# Patient Record
Sex: Male | Born: 1960 | Race: White | Hispanic: No | Marital: Married | Smoking: Former smoker
Health system: Southern US, Community
[De-identification: ages and names within clinical notes are randomized; demographics above are authoritative.]

## PROBLEM LIST (undated history)

## (undated) ENCOUNTER — Emergency Department (HOSPITAL_COMMUNITY): Admission: EM

## (undated) DIAGNOSIS — R739 Hyperglycemia, unspecified: Secondary | ICD-10-CM

## (undated) DIAGNOSIS — E7800 Pure hypercholesterolemia, unspecified: Secondary | ICD-10-CM

## (undated) DIAGNOSIS — E785 Hyperlipidemia, unspecified: Secondary | ICD-10-CM

## (undated) HISTORY — PX: BILATERAL CARPAL TUNNEL RELEASE: SHX6508

## (undated) HISTORY — PX: CARPAL TUNNEL WITH CUBITAL TUNNEL: SHX5608

## (undated) HISTORY — DX: Pure hypercholesterolemia, unspecified: E78.00

## (undated) HISTORY — DX: Hyperlipidemia, unspecified: E78.5

## (undated) HISTORY — DX: Hyperglycemia, unspecified: R73.9

---

## 2006-04-16 ENCOUNTER — Ambulatory Visit: Payer: Self-pay | Admitting: Internal Medicine

## 2006-04-16 LAB — CONVERTED CEMR LAB
Albumin: 4 g/dL (ref 3.5–5.2)
Alkaline Phosphatase: 54 units/L (ref 39–117)
BUN: 15 mg/dL (ref 6–23)
Basophils Absolute: 0 10*3/uL (ref 0.0–0.1)
Bilirubin Urine: NEGATIVE
GFR calc Af Amer: 105 mL/min
HDL: 39.7 mg/dL (ref >39.0)
Hemoglobin, Urine: NEGATIVE
Hemoglobin: 16.1 g/dL (ref 13.0–17.0)
Leukocytes, UA: NEGATIVE
Lymphocytes Relative: 39.2 % (ref 12.0–46.0)
MCHC: 35.4 g/dL (ref 30.0–36.0)
Monocytes Absolute: 0.6 10*3/uL (ref 0.2–0.7)
Monocytes Relative: 8.1 % (ref 3.0–11.0)
Neutro Abs: 3.9 10*3/uL (ref 1.4–7.7)
Potassium: 4.2 meq/L (ref 3.5–5.1)
TSH: 1.21 microintl units/mL (ref 0.35–5.50)
Total Protein: 7.2 g/dL (ref 6.0–8.3)
Triglycerides: 186 mg/dL — ABNORMAL HIGH (ref 0–149)
VLDL: 37 mg/dL (ref 0–40)

## 2006-04-20 ENCOUNTER — Ambulatory Visit: Payer: Self-pay | Admitting: Internal Medicine

## 2006-05-28 ENCOUNTER — Ambulatory Visit (HOSPITAL_BASED_OUTPATIENT_CLINIC_OR_DEPARTMENT_OTHER): Admission: RE | Admit: 2006-05-28 | Discharge: 2006-05-28 | Payer: Self-pay | Admitting: Otolaryngology

## 2006-06-07 ENCOUNTER — Ambulatory Visit: Payer: Self-pay | Admitting: Internal Medicine

## 2008-08-23 ENCOUNTER — Ambulatory Visit: Payer: Self-pay | Admitting: Internal Medicine

## 2008-08-23 LAB — CONVERTED CEMR LAB
ALT: 67 units/L — ABNORMAL HIGH (ref 0–53)
AST: 39 units/L — ABNORMAL HIGH (ref 0–37)
Albumin: 4 g/dL (ref 3.5–5.2)
Alkaline Phosphatase: 62 units/L (ref 39–117)
CO2: 27 meq/L (ref 19–32)
Calcium: 9 mg/dL (ref 8.4–10.5)
Chloride: 105 meq/L (ref 96–112)
Eosinophils Relative: 1.2 % (ref 0.0–5.0)
Glucose, Bld: 110 mg/dL — ABNORMAL HIGH (ref 70–99)
HCT: 44.9 % (ref 39.0–52.0)
Hemoglobin: 15.9 g/dL (ref 13.0–17.0)
Leukocytes, UA: NEGATIVE
Lymphocytes Relative: 23.5 % (ref 12.0–46.0)
Lymphs Abs: 2.6 10*3/uL (ref 0.7–4.0)
Monocytes Relative: 8.5 % (ref 3.0–12.0)
Nitrite: NEGATIVE
Platelets: 226 10*3/uL (ref 150.0–400.0)
Sodium: 140 meq/L (ref 135–145)
Specific Gravity, Urine: 1.025 (ref 1.000–1.030)
Total CHOL/HDL Ratio: 4
Total Protein: 7.4 g/dL (ref 6.0–8.3)
Triglycerides: 182 mg/dL — ABNORMAL HIGH (ref 0.0–149.0)
Urobilinogen, UA: 0.2 (ref 0.0–1.0)
WBC: 10.9 10*3/uL — ABNORMAL HIGH (ref 4.5–10.5)
pH: 5.5 (ref 5.0–8.0)

## 2008-08-28 ENCOUNTER — Ambulatory Visit: Payer: Self-pay | Admitting: Internal Medicine

## 2008-08-28 DIAGNOSIS — G5600 Carpal tunnel syndrome, unspecified upper limb: Secondary | ICD-10-CM | POA: Insufficient documentation

## 2008-08-28 DIAGNOSIS — I10 Essential (primary) hypertension: Secondary | ICD-10-CM

## 2008-08-28 DIAGNOSIS — J309 Allergic rhinitis, unspecified: Secondary | ICD-10-CM | POA: Insufficient documentation

## 2008-08-28 DIAGNOSIS — G4733 Obstructive sleep apnea (adult) (pediatric): Secondary | ICD-10-CM

## 2008-08-29 DIAGNOSIS — K219 Gastro-esophageal reflux disease without esophagitis: Secondary | ICD-10-CM | POA: Insufficient documentation

## 2008-09-20 ENCOUNTER — Telehealth (INDEPENDENT_AMBULATORY_CARE_PROVIDER_SITE_OTHER): Payer: Self-pay | Admitting: *Deleted

## 2008-09-20 ENCOUNTER — Ambulatory Visit: Payer: Self-pay | Admitting: Internal Medicine

## 2008-12-04 ENCOUNTER — Encounter: Payer: Self-pay | Admitting: Internal Medicine

## 2010-01-16 ENCOUNTER — Ambulatory Visit: Payer: Self-pay | Admitting: Internal Medicine

## 2010-01-16 ENCOUNTER — Encounter: Payer: Self-pay | Admitting: Internal Medicine

## 2010-01-17 LAB — CONVERTED CEMR LAB
ALT: 95 units/L — ABNORMAL HIGH (ref 0–53)
AST: 53 units/L — ABNORMAL HIGH (ref 0–37)
Alkaline Phosphatase: 56 units/L (ref 39–117)
Basophils Absolute: 0 10*3/uL (ref 0.0–0.1)
Bilirubin Urine: NEGATIVE
Calcium: 9.9 mg/dL (ref 8.4–10.5)
Cholesterol: 189 mg/dL (ref 0–200)
Eosinophils Relative: 2.3 % (ref 0.0–5.0)
GFR calc non Af Amer: 87.95 mL/min (ref >60)
HCT: 43.4 % (ref 39.0–52.0)
Hemoglobin, Urine: NEGATIVE
Hemoglobin: 15.1 g/dL (ref 13.0–17.0)
Ketones, ur: NEGATIVE mg/dL
LDL Cholesterol: 114 mg/dL — ABNORMAL HIGH (ref 0–99)
Leukocytes, UA: NEGATIVE
Lymphocytes Relative: 34.6 % (ref 12.0–46.0)
Lymphs Abs: 2.9 10*3/uL (ref 0.7–4.0)
Monocytes Relative: 9.3 % (ref 3.0–12.0)
Neutro Abs: 4.5 10*3/uL (ref 1.4–7.7)
Nitrite: NEGATIVE
PSA: 0.6 ng/mL (ref 0.10–4.00)
Platelets: 230 10*3/uL (ref 150.0–400.0)
Potassium: 4.5 meq/L (ref 3.5–5.1)
Sodium: 140 meq/L (ref 135–145)
TSH: 0.84 microintl units/mL (ref 0.35–5.50)
Total Bilirubin: 0.7 mg/dL (ref 0.3–1.2)
Urobilinogen, UA: 0.2 (ref 0.0–1.0)
VLDL: 33.8 mg/dL (ref 0.0–40.0)
WBC: 8.5 10*3/uL (ref 4.5–10.5)
pH: 5.5 (ref 5.0–8.0)

## 2010-04-02 NOTE — Assessment & Plan Note (Signed)
Summary: post Urgent care [place]/#?cd   Vital Signs:  Patient profile:   50 year old male Height:      70 inches Weight:      258 pounds BMI:     37.15 O2 Sat:      95 % on Room air Temp:     98.4 degrees F oral Pulse rate:   82 / minute BP sitting:   120 / 62  (left arm) Cuff size:   large  Vitals Entered By: Zella Ball Ewing CMA Duncan Dull) (January 16, 2010 4:03 PM)  O2 Flow:  Room air  Preventive Care Screening     declines tetenus for nopw  CC: Post Urgent Care/RE   CC:  Post Urgent Care/RE.  History of Present Illness: here for wellness and f/u  - overall doing ok, but was tx recently per [facility] for bronchitis with zpack and tess perle as needed,  asked to f/u here today, and at least 75% improved with less headahce, ST, prod cough and mild sob.  BP there normal, has not been taking the lisinopril for several months, not checking BP at home. Pt denies CP, worsening sob, doe, wheezing, orthopnea, pnd, worsening LE edema, palps, dizziness or syncope  Pt denies new neuro symptoms such as headache, facial or extremity weakness  Pt denies polydipsia, polyuria.  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  No wt loss, night sweats, loss of appetite or other constitutional symptoms Pt states good ability with ADL's, low fall risk, home safety reviewed and adequate, no significant change in hearing or vision, trying to follow lower chol diet, and occasionally active only with regular excercise.  Denies worsening depressive symptoms, suicidal ideation, or panic.    Preventive Screening-Counseling & Management      Drug Use:  no.    Problems Prior to Update: 1)  Carpal Tunnel Syndrome, Bilateral  (ICD-354.0) 2)  Gerd  (ICD-530.81) 3)  Allergic Rhinitis  (ICD-477.9) 4)  Hypertension  (ICD-401.9) 5)  Preventive Health Care  (ICD-V70.0) 6)  Obstructive Sleep Apnea   (ICD-327.23)  Medications Prior to Update: 1)  Daily Multiple Vitamins  Tabs (Multiple Vitamin) .Marland Kitchen.. 1 By Mouth Once Daily 2)  Fluticasone Propionate 50 Mcg/act Susp (Fluticasone Propionate) .... 2 Spray/side Once Daily 3)  Lisinopril 20 Mg Tabs (Lisinopril) .Marland Kitchen.. 1 By Mouth Once Daily 4)  Azithromycin 250 Mg Tabs (Azithromycin) .... 2po Qd For 1 Day, Then 1po Qd For 4days, Then Stop 5)  Promethazine-Codeine 6.25-10 Mg/10ml Syrp (Promethazine-Codeine) .Marland Kitchen.. 1 Tsp By Mouth Q 6 Hrs As Needed Cough 6)  Aspir-Low 81 Mg Tbec (Aspirin) .Marland Kitchen.. 1po Once Daily  Current Medications (verified): 1)  Fluticasone Propionate 50 Mcg/act Susp (Fluticasone Propionate) .... 2 Spray/side Once Daily 2)  Azithromycin 250 Mg Tabs (Azithromycin) .... 2po Qd For 1 Day, Then 1po Qd For 4days, Then Stop 3)  Aspir-Low 81 Mg Tbec (Aspirin) .Marland Kitchen.. 1po Once Daily  Allergies (verified): 1)  ! Penicillin 2)  ! 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 arthritis elevated cholesterol heart disease HTN  Social History: Reviewed history from 08/28/2008 and no changes required. 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         all otherwise negative per pt -  except ongoin LUE CTS symptoms x 4 yrs, now worse pain   Physical Exam  General:  alert and overweight-appearing.   Head:  normocephalic and atraumatic.   Eyes:  vision grossly intact, pupils equal, and pupils round.   Ears:  R ear normal and L ear normal.   Nose:  no external deformity and no nasal discharge.   Mouth:  pharyngeal erythema and fair dentition.   Neck:  supple and no masses.   Lungs:  normal respiratory effort and normal breath sounds.   Heart:  normal rate, regular rhythm, and no murmur.   Abdomen:  soft, non-tender, and normal bowel sounds.   Msk:  no joint tenderness and no joint swelling.   Extremities:  no edema, no ulcers  Neurologic:  cranial nerves II-XII intact and strength normal in all extremities.   Skin:  color normal and no rashes.   Psych:  not depressed appearing and slightly anxious.     Impression & Recommendations:  Problem # 1:  Preventive Health Care (ICD-V70.0) Overall doing well, age appropriate education and counseling updated, referral for preventive services and immunizations addressed, dietary counseling and smoking status adressed , most recent labs reviewed, ecg reviewed I have personally reviewed and have noted 1.The patient's medical and social history 2.Their use of alcohol, tobacco or illicit drugs 3.Their current medications and supplements 4. Functional ability including ADL's, fall risk, home safety risk, hearing & visual impairment  5.Diet and physical  activities 6.Evidence for depression or mood disorders The patients weight, height, BMI  have been recorded in the chart I have made referrals, counseling and provided education to the patient based review of the above  Orders: TLB-BMP (Basic Metabolic Panel-BMET) (80048-METABOL) TLB-CBC Platelet - w/Differential (85025-CBCD) TLB-Hepatic/Liver Function Pnl (80076-HEPATIC) TLB-Lipid Panel (80061-LIPID) TLB-TSH (Thyroid Stimulating Hormone) (84443-TSH) TLB-PSA (Prostate Specific Antigen) (84153-PSA) TLB-Udip ONLY (81003-UDIP) EKG w/ Interpretation (93000)  Problem # 2:  CARPAL TUNNEL SYNDROME, BILATERAL (ICD-354.0) left  > right, ongoing for > 4 yrs, pt now wanting surgury as has been proposed - ok for referral Orders: Orthopedic Surgeon Referral (Ortho Surgeon)  Problem # 3:  HYPERTENSION (ICD-401.9)  The following medications were removed from the medication list:    Lisinopril 20 Mg Tabs (Lisinopril) .Marland Kitchen... 1 by mouth once daily improved for unclear reasons except he has been working on lower salt diet - ok off ACE for now  BP today: 120/62 Prior BP: 130/78 (09/20/2008)  Labs Reviewed: K+: 4.1 (08/23/2008) Creat: : 1.0 (08/23/2008)   Chol: 169 (08/23/2008)   HDL: 39.40 (08/23/2008)   LDL: 93 (08/23/2008)   TG: 182.0 (  08/23/2008)  Complete Medication List: 1)  Fluticasone Propionate 50 Mcg/act Susp (Fluticasone propionate) .... 2 spray/side once daily 2)  Azithromycin 250 Mg Tabs (Azithromycin) .... 2po qd for 1 day, then 1po qd for 4days, then stop 3)  Aspir-low 81 Mg Tbec (Aspirin) .Marland Kitchen.. 1po once daily  Patient Instructions: 1)  Continue all previous medications as before this visit 2)  Please go to the Lab in the basement for your blood and/or urine tests today 3)  Please call the number on the [facility] Card for results of your testing  4)  You will be contacted about the referral(s) to: Dr Amanda Pea 5)  Your EKG was good today 6)  Please schedule a follow-up appointment in 1  year, or sooner if needed 7)  Check your Blood Pressure regularly. If it is above 140/90: you should make an appointment. Prescriptions: FLUTICASONE PROPIONATE 50 MCG/ACT SUSP (FLUTICASONE PROPIONATE) 2 spray/side once daily  #1 x 11   Entered and Authorized by:   Corwin Levins MD   Signed by:   Corwin Levins MD on 01/16/2010   Method used:   Print then Give to Patient   RxID:   [id number]    Orders Added: 1)  Orthopedic Surgeon Referral Gaylord Shih Surgeon] 2)  TLB-BMP (Basic Metabolic Panel-BMET) [80048-METABOL] 3)  TLB-CBC Platelet - w/Differential [85025-CBCD] 4)  TLB-Hepatic/Liver Function Pnl [80076-HEPATIC] 5)  TLB-Lipid Panel [80061-LIPID] 6)  TLB-TSH (Thyroid Stimulating Hormone) [84443-TSH] 7)  TLB-PSA (Prostate Specific Antigen) [84153-PSA] 8)  TLB-Udip ONLY [81003-UDIP] 9)  EKG w/ Interpretation [93000] 10)  Est. Patient 40-64 years [14782]

## 2010-06-26 DIAGNOSIS — Z0289 Encounter for other administrative examinations: Secondary | ICD-10-CM

## 2010-07-19 NOTE — Procedures (Signed)
NAME:  ZEB, RAWL NO.:  [id number]   MEDICAL RECORD NO.:  [id number]          PATIENT TYPE:  OUT   LOCATION:  SLEEP CENTER                 FACILITY:  [facility]   PHYSICIAN:  Clinton D. Maple Hudson, MD, FCCP, FACPDATE OF BIRTH:  1960-12-06   DATE OF STUDY:  05/28/2006                            NOCTURNAL POLYSOMNOGRAM   REFERRING PHYSICIAN:   INDICATION FOR STUDY:  Hypersomnia with sleep apnea.   EPWORTH SLEEPINESS SCORE:  18/24, BMI 32.8, weight 235 pounds.   MEDICATIONS:  Listed and reviewed.   SLEEP ARCHITECTURE:  Total sleep time 372 minutes with sleep efficiency  89%.  Stage I was 5%, stage II 78%, stages III and IV absent, REM 18% of  total sleep time.  Sleep latency 3 minutes, REM latency 112 minutes.  Awake after sleep onset 44 minutes.  Arousal index 8.9.  Three Advil  were taken at bedtime for headache.   RESPIRATORY DATA:  Split study protocol.  Apnea/hypopnea index (AHI,  RDI) 107.4 obstructive events per hour, indicating severe obstructive  sleep apnea/hypopnea syndrome before CPAP.  This included 144  obstructive apneas and 87 hypopneas before CPAP.  All sleep and all  events were while supine.  REM AHI 5.5 per hour.  CPAP was titrated 212  CWP, AHI 2.4 per hour.  A small mirage Quattro full face mask was used  with heated humidifier.   OXYGEN DATA:  Moderate snoring with oxygen desaturation to a nadir of  84%.  Mean oxygen saturation after CPAP control was 95% on room air.   CARDIAC DATA:  Normal sinus rhythm.   MOVEMENT-PARASOMNIA:  Occasional limb jerk, insignificant.   IMPRESSIONS-RECOMMENDATIONS:  1. Severe obstructive sleep apnea/hypopnea syndrome, AHI 107.4 per      hour.  All sleep and all events were while supine.  Moderate      snoring with oxygen desaturation to a nadir of 84%.  2. Successful CPAP titration to 12 CWP, AHI 2.4 per hour.  A small      Mirage Quattro full face mask was used      with heated humidifier.  3. Note that he  took three Advil at the time for headache.      Clinton D. Maple Hudson, MD, [facility], FACP  Diplomate, Biomedical engineer of Sleep Medicine  Electronically Signed     CDY/MEDQ  D:  06/07/2006 12:50:42  T:  06/07/2006 22:49:55  Job:  [id number]

## 2011-05-02 ENCOUNTER — Other Ambulatory Visit: Payer: Self-pay | Admitting: Internal Medicine

## 2012-08-20 ENCOUNTER — Telehealth: Payer: Self-pay

## 2012-08-20 NOTE — Telephone Encounter (Signed)
Sorry, No, as he has not been seen here apparently for many years (> 3 yrs)

## 2012-08-20 NOTE — Telephone Encounter (Signed)
Patient wife called stating that he recently had a DOT physical done at [facility] due to our office no longer preforming them. In addition to the CPX, pt employer is requiring a letter from PCP stating that he is compliant with his CPAP machine. Thanks

## 2012-08-23 NOTE — Telephone Encounter (Signed)
Patient informed and he did state request had already been taken care of.

## 2014-05-10 ENCOUNTER — Encounter: Payer: Self-pay | Admitting: Internal Medicine

## 2014-06-11 ENCOUNTER — Other Ambulatory Visit: Payer: Self-pay | Admitting: Internal Medicine

## 2014-08-31 ENCOUNTER — Other Ambulatory Visit (INDEPENDENT_AMBULATORY_CARE_PROVIDER_SITE_OTHER): Payer: BLUE CROSS/BLUE SHIELD

## 2014-08-31 ENCOUNTER — Encounter: Payer: Self-pay | Admitting: Internal Medicine

## 2014-08-31 ENCOUNTER — Ambulatory Visit (INDEPENDENT_AMBULATORY_CARE_PROVIDER_SITE_OTHER): Payer: BLUE CROSS/BLUE SHIELD | Admitting: Internal Medicine

## 2014-08-31 VITALS — BP 138/88 | HR 78 | Temp 98.6°F | Ht 71.0 in | Wt 268.0 lb

## 2014-08-31 DIAGNOSIS — Z0000 Encounter for general adult medical examination without abnormal findings: Secondary | ICD-10-CM

## 2014-08-31 DIAGNOSIS — Z0001 Encounter for general adult medical examination with abnormal findings: Secondary | ICD-10-CM | POA: Insufficient documentation

## 2014-08-31 DIAGNOSIS — G4733 Obstructive sleep apnea (adult) (pediatric): Secondary | ICD-10-CM

## 2014-08-31 DIAGNOSIS — Z0189 Encounter for other specified special examinations: Secondary | ICD-10-CM

## 2014-08-31 LAB — BASIC METABOLIC PANEL
BUN: 20 mg/dL (ref 6–23)
CO2: 28 mEq/L (ref 19–32)
Calcium: 9.4 mg/dL (ref 8.4–10.5)
Chloride: 102 mEq/L (ref 96–112)
Creatinine, Ser: 1.05 mg/dL (ref 0.40–1.50)
GFR: 78.77 mL/min (ref >60.00)
Glucose, Bld: 109 mg/dL — ABNORMAL HIGH (ref 70–99)
Potassium: 4.2 mEq/L (ref 3.5–5.1)
Sodium: 137 mEq/L (ref 135–145)

## 2014-08-31 LAB — CBC WITH DIFFERENTIAL/PLATELET
Basophils Absolute: 0 10*3/uL (ref 0.0–0.1)
Basophils Relative: 0.2 % (ref 0.0–3.0)
Eosinophils Absolute: 0.1 10*3/uL (ref 0.0–0.7)
Eosinophils Relative: 0.8 % (ref 0.0–5.0)
HCT: 48.4 % (ref 39.0–52.0)
Hemoglobin: 16.5 g/dL (ref 13.0–17.0)
Lymphocytes Relative: 28.4 % (ref 12.0–46.0)
Lymphs Abs: 3.8 10*3/uL (ref 0.7–4.0)
MCHC: 34.1 g/dL (ref 30.0–36.0)
MCV: 94.5 fl (ref 78.0–100.0)
Monocytes Absolute: 1 10*3/uL (ref 0.1–1.0)
Monocytes Relative: 7.3 % (ref 3.0–12.0)
Neutro Abs: 8.6 10*3/uL — ABNORMAL HIGH (ref 1.4–7.7)
Neutrophils Relative %: 63.3 % (ref 43.0–77.0)
Platelets: 269 10*3/uL (ref 150.0–400.0)
RBC: 5.12 Mil/uL (ref 4.22–5.81)
RDW: 13.3 % (ref 11.5–15.5)
WBC: 13.6 10*3/uL — ABNORMAL HIGH (ref 4.0–10.5)

## 2014-08-31 LAB — URINALYSIS, ROUTINE W REFLEX MICROSCOPIC
BILIRUBIN URINE: NEGATIVE
HGB URINE DIPSTICK: NEGATIVE
KETONES UR: NEGATIVE
LEUKOCYTES UA: NEGATIVE
Nitrite: NEGATIVE
PH: 6 (ref 5.0–8.0)
RBC / HPF: NONE SEEN (ref 0–?)
Total Protein, Urine: NEGATIVE
Urine Glucose: NEGATIVE
Urobilinogen, UA: 0.2 (ref 0.0–1.0)
WBC, UA: NONE SEEN (ref 0–?)

## 2014-08-31 LAB — LIPID PANEL
CHOL/HDL RATIO: 4
Cholesterol: 177 mg/dL (ref 0–200)
HDL: 42.6 mg/dL (ref >39.00)
LDL Cholesterol: 110 mg/dL — ABNORMAL HIGH (ref 0–99)
NonHDL: 134.4
Triglycerides: 122 mg/dL (ref 0.0–149.0)
VLDL: 24.4 mg/dL (ref 0.0–40.0)

## 2014-08-31 LAB — TSH: TSH: 1.43 u[IU]/mL (ref 0.35–4.50)

## 2014-08-31 LAB — HEPATIC FUNCTION PANEL
ALT: 100 U/L — ABNORMAL HIGH (ref 0–53)
AST: 51 U/L — ABNORMAL HIGH (ref 0–37)
Albumin: 4.4 g/dL (ref 3.5–5.2)
Alkaline Phosphatase: 74 U/L (ref 39–117)
BILIRUBIN TOTAL: 0.7 mg/dL (ref 0.2–1.2)
Bilirubin, Direct: 0.2 mg/dL (ref 0.0–0.3)
Total Protein: 7.6 g/dL (ref 6.0–8.3)

## 2014-08-31 LAB — PSA: PSA: 0.69 ng/mL (ref 0.10–4.00)

## 2014-08-31 MED ORDER — ASPIRIN EC 81 MG PO TBEC: 81.0000 mg | DELAYED_RELEASE_TABLET | Freq: Every day | ORAL | Status: AC

## 2014-08-31 NOTE — Assessment & Plan Note (Signed)
Encouraged to f/u with Dr Macie BurowsNewman/ENT for followup

## 2014-08-31 NOTE — Progress Notes (Signed)
Subjective:    Patient ID: Brendan Washington, male    DOB: 1960-11-13, 54 y.o.   MRN: [id number]  HPI   Here for wellness and f/u;  Overall doing ok;  Pt denies Chest pain, worsening SOB, DOE, wheezing, orthopnea, PND, worsening LE edema, palpitations, dizziness or syncope.  Pt denies neurological change such as new headache, facial or extremity weakness.  Pt denies polydipsia, polyuria, or low sugar symptoms. Pt states overall good compliance with treatment and medications, good tolerability, and has been trying to follow appropriate diet.  Pt denies worsening depressive symptoms, suicidal ideation or panic. No fever, night sweats, wt loss, loss of appetite, or other constitutional symptoms.  Pt states good ability with ADL's, has low fall risk, home safety reviewed and adequate, no other significant changes in hearing or vision, and only occasionally active with exercise.  Due for colonoscopy. No current complaints  Hard to lose wt, owns concrete pouring company, working very hard currently Past Medical History  Diagnosis Date  . Hypercholesteremia    Past Surgical History  Procedure Laterality Date  . Bilateral carpal tunnel release    . Carpal tunnel with cubital tunnel      reports that he quit smoking about 19 years ago. He does not have any smokeless tobacco history on file. His alcohol and drug histories are not on file. family history includes Heart disease in his father; Parkinson's disease in his mother. Allergies  Allergen Reactions  . Penicillins   . Tetanus Toxoid     REACTION: arm swelling   Current Outpatient Prescriptions on File Prior to Visit  Medication Sig Dispense Refill  . fluticasone (FLONASE) 50 MCG/ACT nasal spray USE TWO SPRAYS IN ONE NOSTRIL ONCE DAILY. 16 g 0   No current facility-administered medications on file prior to visit.    Review of Systems Constitutional: Negative for increased diaphoresis, other activity, appetite or siginficant weight change  other than noted HENT: Negative for worsening hearing loss, ear pain, facial swelling, mouth sores and neck stiffness.   Eyes: Negative for other worsening pain, redness or visual disturbance.  Respiratory: Negative for shortness of breath and wheezing  Cardiovascular: Negative for chest pain and palpitations.  Gastrointestinal: Negative for diarrhea, blood in stool, abdominal distention or other pain Genitourinary: Negative for hematuria, flank pain or change in urine volume.  Musculoskeletal: Negative for myalgias or other joint complaints.  Skin: Negative for color change and wound or drainage.  Neurological: Negative for syncope and numbness. other than noted Hematological: Negative for adenopathy. or other swelling Psychiatric/Behavioral: Negative for hallucinations, SI, self-injury, decreased concentration or other worsening agitation.      Objective:   Physical Exam BP 138/88 mmHg  Pulse 78  Temp(Src) 98.6 F (37 C) (Oral)  Ht 5\' 11"  (1.803 m)  Wt 268 lb (121.564 kg)  BMI 37.39 kg/m2  SpO2 95% VS noted,  Constitutional: Pt is oriented to person, place, and time. Appears well-developed and well-nourished, in no significant distress Head: Normocephalic and atraumatic.  Right Ear: External ear normal.  Left Ear: External ear normal.  Nose: Nose normal.  Mouth/Throat: Oropharynx is clear and moist.  Eyes: Conjunctivae and EOM are normal. Pupils are equal, round, and reactive to light.  Neck: Normal range of motion. Neck supple. No JVD present. No tracheal deviation present or significant neck LA or mass Cardiovascular: Normal rate, regular rhythm, normal heart sounds and intact distal pulses.   Pulmonary/Chest: Effort normal and breath sounds without rales or wheezing  Abdominal:  Soft. Bowel sounds are normal. NT. No HSM  Musculoskeletal: Normal range of motion. Exhibits no edema.  Lymphadenopathy:  Has no cervical adenopathy.  Neurological: Pt is alert and oriented to  person, place, and time. Pt has normal reflexes. No cranial nerve deficit. Motor grossly intact Skin: Skin is warm and dry. No rash noted.  Psychiatric:  Has normal mood and affect. Behavior is normal.     Assessment & Plan:

## 2014-08-31 NOTE — Progress Notes (Signed)
Pre visit review using our clinic review tool, if applicable. No additional management support is needed unless otherwise documented below in the visit note. 

## 2014-08-31 NOTE — Patient Instructions (Signed)
Please start Aspirin 81 mg - 1 per day - VERY important to reduce risk of stroke or heart attack in the future  Please continue all other medications as before, and refills have been done if requested.  Please have the pharmacy call with any other refills you may need.  Please continue your efforts at being more active, low cholesterol diet, and weight control.  You are otherwise up to date with prevention measures today.  Please keep your appointments with your specialists as you may have planned  Please consider making follow up visit with Dr Macie BurowsNewman/ENT regarding the sleep apnea for checkup  You will be contacted regarding the referral for: colonoscopy  Please go to the LAB in the Basement (turn left off the elevator) for the tests to be done today  You will be contacted by phone if any changes need to be made immediately.  Otherwise, you will receive a letter about your results with an explanation, but please check with MyChart first.  Please remember to sign up for MyChart if you have not done so, as this will be important to you in the future with finding out test results, communicating by private email, and scheduling acute appointments online when needed.  Please return in 1 year for your yearly visit, or sooner if needed, with Lab testing done 3-5 days before

## 2014-08-31 NOTE — Assessment & Plan Note (Signed)

## 2014-09-01 ENCOUNTER — Encounter: Payer: Self-pay | Admitting: Internal Medicine

## 2014-09-18 ENCOUNTER — Encounter: Payer: Self-pay | Admitting: Gastroenterology

## 2014-11-27 ENCOUNTER — Encounter: Payer: BLUE CROSS/BLUE SHIELD | Admitting: Gastroenterology

## 2015-07-31 ENCOUNTER — Other Ambulatory Visit (INDEPENDENT_AMBULATORY_CARE_PROVIDER_SITE_OTHER): Payer: BLUE CROSS/BLUE SHIELD

## 2015-07-31 ENCOUNTER — Encounter: Payer: Self-pay | Admitting: Internal Medicine

## 2015-07-31 ENCOUNTER — Ambulatory Visit (INDEPENDENT_AMBULATORY_CARE_PROVIDER_SITE_OTHER): Payer: BLUE CROSS/BLUE SHIELD | Admitting: Internal Medicine

## 2015-07-31 ENCOUNTER — Telehealth: Payer: Self-pay | Admitting: Internal Medicine

## 2015-07-31 ENCOUNTER — Other Ambulatory Visit: Payer: Self-pay | Admitting: Internal Medicine

## 2015-07-31 VITALS — BP 122/80 | HR 75 | Temp 98.7°F | Resp 20 | Wt 265.0 lb

## 2015-07-31 DIAGNOSIS — Z0000 Encounter for general adult medical examination without abnormal findings: Secondary | ICD-10-CM

## 2015-07-31 DIAGNOSIS — I10 Essential (primary) hypertension: Secondary | ICD-10-CM | POA: Diagnosis not present

## 2015-07-31 DIAGNOSIS — E119 Type 2 diabetes mellitus without complications: Secondary | ICD-10-CM

## 2015-07-31 DIAGNOSIS — E785 Hyperlipidemia, unspecified: Secondary | ICD-10-CM | POA: Insufficient documentation

## 2015-07-31 DIAGNOSIS — R739 Hyperglycemia, unspecified: Secondary | ICD-10-CM | POA: Diagnosis not present

## 2015-07-31 DIAGNOSIS — Z0189 Encounter for other specified special examinations: Secondary | ICD-10-CM

## 2015-07-31 HISTORY — DX: Hyperglycemia, unspecified: R73.9

## 2015-07-31 HISTORY — DX: Hyperlipidemia, unspecified: E78.5

## 2015-07-31 LAB — BASIC METABOLIC PANEL
BUN: 12 mg/dL (ref 6–23)
CHLORIDE: 103 meq/L (ref 96–112)
CO2: 27 mEq/L (ref 19–32)
CREATININE: 1 mg/dL (ref 0.40–1.50)
Calcium: 9.5 mg/dL (ref 8.4–10.5)
GFR: 83.04 mL/min (ref >60.00)
Glucose, Bld: 123 mg/dL — ABNORMAL HIGH (ref 70–99)
Potassium: 4.2 mEq/L (ref 3.5–5.1)
Sodium: 138 mEq/L (ref 135–145)

## 2015-07-31 LAB — CBC WITH DIFFERENTIAL/PLATELET
BASOS PCT: 0.3 % (ref 0.0–3.0)
Basophils Absolute: 0 10*3/uL (ref 0.0–0.1)
EOS ABS: 0.2 10*3/uL (ref 0.0–0.7)
EOS PCT: 1.7 % (ref 0.0–5.0)
HEMATOCRIT: 47.8 % (ref 39.0–52.0)
Hemoglobin: 16.3 g/dL (ref 13.0–17.0)
LYMPHS PCT: 38.9 % (ref 12.0–46.0)
Lymphs Abs: 3.5 10*3/uL (ref 0.7–4.0)
MCHC: 34.1 g/dL (ref 30.0–36.0)
MCV: 93 fl (ref 78.0–100.0)
MONOS PCT: 8.3 % (ref 3.0–12.0)
Monocytes Absolute: 0.7 10*3/uL (ref 0.1–1.0)
NEUTROS ABS: 4.6 10*3/uL (ref 1.4–7.7)
Neutrophils Relative %: 50.8 % (ref 43.0–77.0)
PLATELETS: 238 10*3/uL (ref 150.0–400.0)
RBC: 5.15 Mil/uL (ref 4.22–5.81)
RDW: 13.2 % (ref 11.5–15.5)
WBC: 9 10*3/uL (ref 4.0–10.5)

## 2015-07-31 LAB — HEMOGLOBIN A1C: Hgb A1c MFr Bld: 7.8 % — ABNORMAL HIGH (ref 4.6–6.5)

## 2015-07-31 LAB — URINALYSIS, ROUTINE W REFLEX MICROSCOPIC
BILIRUBIN URINE: NEGATIVE
HGB URINE DIPSTICK: NEGATIVE
KETONES UR: NEGATIVE
Leukocytes, UA: NEGATIVE
NITRITE: NEGATIVE
PH: 5.5 (ref 5.0–8.0)
RBC / HPF: NONE SEEN (ref 0–?)
Specific Gravity, Urine: 1.02 (ref 1.000–1.030)
Total Protein, Urine: NEGATIVE
Urine Glucose: NEGATIVE
Urobilinogen, UA: 0.2 (ref 0.0–1.0)

## 2015-07-31 LAB — HEPATIC FUNCTION PANEL
ALT: 91 U/L — AB (ref 0–53)
AST: 52 U/L — ABNORMAL HIGH (ref 0–37)
Albumin: 4.4 g/dL (ref 3.5–5.2)
Alkaline Phosphatase: 63 U/L (ref 39–117)
BILIRUBIN DIRECT: 0.2 mg/dL (ref 0.0–0.3)
BILIRUBIN TOTAL: 0.7 mg/dL (ref 0.2–1.2)
Total Protein: 7 g/dL (ref 6.0–8.3)

## 2015-07-31 LAB — LIPID PANEL
CHOL/HDL RATIO: 4
Cholesterol: 184 mg/dL (ref 0–200)
HDL: 41.1 mg/dL (ref >39.00)
LDL CALC: 112 mg/dL — AB (ref 0–99)
NONHDL: 142.56
Triglycerides: 152 mg/dL — ABNORMAL HIGH (ref 0.0–149.0)
VLDL: 30.4 mg/dL (ref 0.0–40.0)

## 2015-07-31 LAB — PSA: PSA: 0.8 ng/mL (ref 0.10–4.00)

## 2015-07-31 LAB — TSH: TSH: 1.78 u[IU]/mL (ref 0.35–4.50)

## 2015-07-31 MED ORDER — ATORVASTATIN CALCIUM 10 MG PO TABS: 10.0000 mg | ORAL_TABLET | Freq: Every day | ORAL | Status: DC

## 2015-07-31 MED ORDER — METFORMIN HCL ER 500 MG PO TB24: 500.0000 mg | ORAL_TABLET | Freq: Every day | ORAL | Status: DC

## 2015-07-31 NOTE — Progress Notes (Signed)
Pre visit review using our clinic review tool, if applicable. No additional management support is needed unless otherwise documented below in the visit note. 

## 2015-07-31 NOTE — Patient Instructions (Signed)
Please continue all other medications as before, and refills have been done if requested.  Please have the pharmacy call with any other refills you may need.  Please continue your efforts at being more active, low cholesterol diet, and weight control.  You are otherwise up to date with prevention measures today.  Please keep your appointments with your specialists as you may have planned  You will be contacted regarding the referral for: cologuard  Please go to the LAB in the Basement (turn left off the elevator) for the tests to be done today  You will be contacted by phone if any changes need to be made immediately.  Otherwise, you will receive a letter about your results with an explanation, but please check with MyChart first.  Please remember to sign up for MyChart if you have not done so, as this will be important to you in the future with finding out test results, communicating by private email, and scheduling acute appointments online when needed.  Please return in 1 year for your yearly visit, or sooner if needed, with Lab testing done 3-5 days before  

## 2015-07-31 NOTE — Telephone Encounter (Signed)
Pt wife called in and would like to get the results from his blood work.  She would also like to pick up a copy of it

## 2015-07-31 NOTE — Assessment & Plan Note (Addendum)
Asympt, stable overall by history and exam, recent data reviewed with pt, and pt to continue medical treatment as before,  to f/u any worsening symptoms or concerns, but for a1c today  In addition to the time spent performing CPE, I spent an additional 15 minutes face to face,in which greater than 50% of this time was spent in counseling and coordination of care for patient's illness as documented.

## 2015-07-31 NOTE — Progress Notes (Signed)
Subjective:    Patient ID: Brendan Washington, male    DOB: Dec 22, 1960, 55 y.o.   MRN: [id number]  HPI  Here for wellness and f/u;  Overall doing ok;  Pt denies Chest pain, worsening SOB, DOE, wheezing, orthopnea, PND, worsening LE edema, palpitations, dizziness or syncope.  Pt denies neurological change such as new headache, facial or extremity weakness.  Pt denies polydipsia, polyuria, or low sugar symptoms. Pt states overall good compliance with treatment and medications, good tolerability, and has been trying to follow appropriate diet.  Pt denies worsening depressive symptoms, suicidal ideation or panic. No fever, night sweats, wt loss, loss of appetite, or other constitutional symptoms.  Pt states good ability with ADL's, has low fall risk, home safety reviewed and adequate, no other significant changes in hearing or vision, and only occasionally active with exercise. Also recently after lunch at [employer] had his DOT physical at [facility], found to have elev BS > 200 per pt (no records), needs A1c < 10 to keep DOT certified.  Asks for cologuard instead of colonsocpy. Past Medical History  Diagnosis Date  . Hypercholesteremia   . Hyperglycemia 07/31/2015   Past Surgical History  Procedure Laterality Date  . Bilateral carpal tunnel release    . Carpal tunnel with cubital tunnel      reports that he quit smoking about 20 years ago. He does not have any smokeless tobacco history on file. His alcohol and drug histories are not on file. family history includes Heart disease in his father; Parkinson's disease in his mother. Allergies  Allergen Reactions  . Penicillins   . Tetanus Toxoid     REACTION: arm swelling   Current Outpatient Prescriptions on File Prior to Visit  Medication Sig Dispense Refill  . aspirin EC 81 MG tablet Take 1 tablet (81 mg total) by mouth daily. 90 tablet 11  . fluticasone (FLONASE) 50 MCG/ACT nasal spray USE TWO SPRAYS IN ONE NOSTRIL ONCE DAILY. 16 g 0   No current  facility-administered medications on file prior to visit.   Review of Systems Constitutional: Negative for increased diaphoresis, or other activity, appetite or siginficant weight change other than noted HENT: Negative for worsening hearing loss, ear pain, facial swelling, mouth sores and neck stiffness.   Eyes: Negative for other worsening pain, redness or visual disturbance.  Respiratory: Negative for choking or stridor Cardiovascular: Negative for other chest pain and palpitations.  Gastrointestinal: Negative for worsening diarrhea, blood in stool, or abdominal distention Genitourinary: Negative for hematuria, flank pain or change in urine volume.  Musculoskeletal: Negative for myalgias or other joint complaints.  Skin: Negative for other color change and wound or drainage.  Neurological: Negative for syncope and numbness. other than noted Hematological: Negative for adenopathy. or other swelling Psychiatric/Behavioral: Negative for hallucinations, SI, self-injury, decreased concentration or other worsening agitation.      Objective:   Physical Exam BP 122/80 mmHg  Pulse 75  Temp(Src) 98.7 F (37.1 C) (Oral)  Resp 20  Wt 265 lb (120.203 kg)  SpO2 98% VS noted, morbid obese Constitutional: Pt is oriented to person, place, and time. Appears well-developed and well-nourished, in no significant distress Head: Normocephalic and atraumatic  Eyes: Conjunctivae and EOM are normal. Pupils are equal, round, and reactive to light Right Ear: External ear normal.  Left Ear: External ear normal Nose: Nose normal.  Mouth/Throat: Oropharynx is clear and moist  Neck: Normal range of motion. Neck supple. No JVD present. No tracheal deviation present or significant  neck LA or mass Cardiovascular: Normal rate, regular rhythm, normal heart sounds and intact distal pulses.   Pulmonary/Chest: Effort normal and breath sounds without rales or wheezing  Abdominal: Soft. Bowel sounds are normal. NT. No  HSM  Musculoskeletal: Normal range of motion. Exhibits no edema Lymphadenopathy: Has no cervical adenopathy.  Neurological: Pt is alert and oriented to person, place, and time. Pt has normal reflexes. No cranial nerve deficit. Motor grossly intact Skin: Skin is warm and dry. No rash noted or new ulcers Psychiatric:  Has normal mood and affect. Behavior is normal.     Assessment & Plan:

## 2015-07-31 NOTE — Assessment & Plan Note (Signed)
stable overall by history and exam, recent data reviewed with pt, and pt to continue medical treatment as before,  to f/u any worsening symptoms or concerns BP Readings from Last 3 Encounters:  07/31/15 122/80  08/31/14 138/88  01/16/10 120/62

## 2015-07-31 NOTE — Assessment & Plan Note (Signed)

## 2015-10-03 LAB — COLOGUARD: Cologuard: NEGATIVE

## 2015-11-13 ENCOUNTER — Encounter: Payer: Self-pay | Admitting: Internal Medicine

## 2015-11-13 NOTE — Telephone Encounter (Signed)
Corinne, can you check on this and let me know? thanks

## 2016-01-02 ENCOUNTER — Telehealth: Payer: Self-pay | Admitting: Internal Medicine

## 2016-01-02 DIAGNOSIS — R5383 Other fatigue: Secondary | ICD-10-CM

## 2016-01-02 NOTE — Telephone Encounter (Signed)
I added the thyroid test to lab  All lab do not expire until dec 26, so no need to change any dates, thanks

## 2016-01-02 NOTE — Telephone Encounter (Signed)
Patient wants to come in for A1C check and thyroid check next week.  Looks like he has labs entered for a expected date of 10/27.  Can we please move the expected date out.

## 2017-04-24 ENCOUNTER — Encounter: Payer: Self-pay | Admitting: Internal Medicine

## 2018-09-24 ENCOUNTER — Encounter: Payer: Self-pay | Admitting: Internal Medicine

## 2018-09-24 ENCOUNTER — Ambulatory Visit (INDEPENDENT_AMBULATORY_CARE_PROVIDER_SITE_OTHER): Payer: No Typology Code available for payment source | Admitting: Internal Medicine

## 2018-09-24 ENCOUNTER — Other Ambulatory Visit: Payer: Self-pay

## 2018-09-24 ENCOUNTER — Other Ambulatory Visit (INDEPENDENT_AMBULATORY_CARE_PROVIDER_SITE_OTHER): Payer: No Typology Code available for payment source

## 2018-09-24 VITALS — BP 124/78 | HR 81 | Temp 97.9°F | Ht 71.0 in | Wt 259.0 lb

## 2018-09-24 DIAGNOSIS — E538 Deficiency of other specified B group vitamins: Secondary | ICD-10-CM | POA: Diagnosis not present

## 2018-09-24 DIAGNOSIS — Z0000 Encounter for general adult medical examination without abnormal findings: Secondary | ICD-10-CM

## 2018-09-24 DIAGNOSIS — E611 Iron deficiency: Secondary | ICD-10-CM

## 2018-09-24 DIAGNOSIS — E559 Vitamin D deficiency, unspecified: Secondary | ICD-10-CM

## 2018-09-24 DIAGNOSIS — E119 Type 2 diabetes mellitus without complications: Secondary | ICD-10-CM | POA: Diagnosis not present

## 2018-09-24 DIAGNOSIS — Z114 Encounter for screening for human immunodeficiency virus [HIV]: Secondary | ICD-10-CM

## 2018-09-24 DIAGNOSIS — Z1159 Encounter for screening for other viral diseases: Secondary | ICD-10-CM

## 2018-09-24 LAB — CBC WITH DIFFERENTIAL/PLATELET
Basophils Absolute: 0 10*3/uL (ref 0.0–0.1)
Basophils Relative: 0.6 % (ref 0.0–3.0)
Eosinophils Absolute: 0.1 10*3/uL (ref 0.0–0.7)
Eosinophils Relative: 1.9 % (ref 0.0–5.0)
HCT: 47.9 % (ref 39.0–52.0)
Hemoglobin: 16.3 g/dL (ref 13.0–17.0)
Lymphocytes Relative: 37.7 % (ref 12.0–46.0)
Lymphs Abs: 2.9 10*3/uL (ref 0.7–4.0)
MCHC: 33.9 g/dL (ref 30.0–36.0)
MCV: 96.4 fl (ref 78.0–100.0)
Monocytes Absolute: 0.7 10*3/uL (ref 0.1–1.0)
Monocytes Relative: 9.3 % (ref 3.0–12.0)
Neutro Abs: 3.8 10*3/uL (ref 1.4–7.7)
Neutrophils Relative %: 50.5 % (ref 43.0–77.0)
Platelets: 203 10*3/uL (ref 150.0–400.0)
RBC: 4.97 Mil/uL (ref 4.22–5.81)
RDW: 13.1 % (ref 11.5–15.5)
WBC: 7.6 10*3/uL (ref 4.0–10.5)

## 2018-09-24 LAB — HEPATIC FUNCTION PANEL
ALT: 74 U/L — ABNORMAL HIGH (ref 0–53)
AST: 37 U/L (ref 0–37)
Albumin: 4.4 g/dL (ref 3.5–5.2)
Alkaline Phosphatase: 72 U/L (ref 39–117)
Bilirubin, Direct: 0.1 mg/dL (ref 0.0–0.3)
Total Bilirubin: 0.7 mg/dL (ref 0.2–1.2)
Total Protein: 7.1 g/dL (ref 6.0–8.3)

## 2018-09-24 LAB — BASIC METABOLIC PANEL
BUN: 14 mg/dL (ref 6–23)
CO2: 26 mEq/L (ref 19–32)
Calcium: 9.4 mg/dL (ref 8.4–10.5)
Chloride: 102 mEq/L (ref 96–112)
Creatinine, Ser: 0.98 mg/dL (ref 0.40–1.50)
GFR: 79.04 mL/min (ref >60.00)
Glucose, Bld: 170 mg/dL — ABNORMAL HIGH (ref 70–99)
Potassium: 4.4 mEq/L (ref 3.5–5.1)
Sodium: 138 mEq/L (ref 135–145)

## 2018-09-24 LAB — URINALYSIS, ROUTINE W REFLEX MICROSCOPIC
Bilirubin Urine: NEGATIVE
Hgb urine dipstick: NEGATIVE
Ketones, ur: NEGATIVE
Leukocytes,Ua: NEGATIVE
Nitrite: NEGATIVE
RBC / HPF: NONE SEEN (ref 0–?)
Specific Gravity, Urine: 1.025 (ref 1.000–1.030)
Total Protein, Urine: NEGATIVE
Urine Glucose: NEGATIVE
Urobilinogen, UA: 0.2 (ref 0.0–1.0)
pH: 5.5 (ref 5.0–8.0)

## 2018-09-24 LAB — IBC PANEL
Iron: 114 ug/dL (ref 42–165)
Saturation Ratios: 36.5 % (ref 20.0–50.0)
Transferrin: 223 mg/dL (ref 212.0–360.0)

## 2018-09-24 LAB — PSA: PSA: 0.61 ng/mL (ref 0.10–4.00)

## 2018-09-24 LAB — LIPID PANEL
Cholesterol: 164 mg/dL (ref 0–200)
HDL: 40.6 mg/dL (ref >39.00)
LDL Cholesterol: 89 mg/dL (ref 0–99)
NonHDL: 123.57
Total CHOL/HDL Ratio: 4
Triglycerides: 174 mg/dL — ABNORMAL HIGH (ref 0.0–149.0)
VLDL: 34.8 mg/dL (ref 0.0–40.0)

## 2018-09-24 LAB — HEMOGLOBIN A1C: Hgb A1c MFr Bld: 8.1 % — ABNORMAL HIGH (ref 4.6–6.5)

## 2018-09-24 LAB — MICROALBUMIN / CREATININE URINE RATIO
Creatinine,U: 143.6 mg/dL
Microalb Creat Ratio: 0.5 mg/g (ref 0.0–30.0)
Microalb, Ur: <0.7 mg/dL (ref 0.0–1.9)

## 2018-09-24 LAB — VITAMIN D 25 HYDROXY (VIT D DEFICIENCY, FRACTURES): VITD: 25.2 ng/mL — ABNORMAL LOW (ref 30.00–100.00)

## 2018-09-24 LAB — TSH: TSH: 1.36 u[IU]/mL (ref 0.35–4.50)

## 2018-09-24 LAB — VITAMIN B12: Vitamin B-12: 543 pg/mL (ref 211–911)

## 2018-09-24 NOTE — Progress Notes (Signed)
Subjective:    Patient ID: Brendan Washington, male    DOB: 06/03/1960, 58 y.o.   MRN: [id number]  HPI  Here for wellness and f/u;  Overall doing ok;  Pt denies Chest pain, worsening SOB, DOE, wheezing, orthopnea, PND, worsening LE edema, palpitations, dizziness or syncope.  Pt denies neurological change such as new headache, facial or extremity weakness.  Pt denies polydipsia, polyuria, or low sugar symptoms. Pt states overall good compliance with treatment and medications, good tolerability, and has been trying to follow appropriate diet.  Pt denies worsening depressive symptoms, suicidal ideation or panic. No fever, night sweats, wt loss, loss of appetite, or other constitutional symptoms.  Pt states good ability with ADL's, has low fall risk, home safety reviewed and adequate, no other significant changes in hearing or vision, and only occasionally active with exercise.  No new complaints. Past Medical History:  Diagnosis Date  . Hypercholesteremia   . Hyperglycemia 07/31/2015  . Hyperlipidemia 07/31/2015   Past Surgical History:  Procedure Laterality Date  . BILATERAL CARPAL TUNNEL RELEASE    . CARPAL TUNNEL WITH CUBITAL TUNNEL      reports that he quit smoking about 23 years ago. He has never used smokeless tobacco. No history on file for alcohol and drug. family history includes Heart disease in his father; Parkinson's disease in his mother. Allergies  Allergen Reactions  . Penicillins   . Tetanus Toxoid     REACTION: arm swelling   Current Outpatient Medications on File Prior to Visit  Medication Sig Dispense Refill  . aspirin EC 81 MG tablet Take 1 tablet (81 mg total) by mouth daily. 90 tablet 11  . atorvastatin (LIPITOR) 10 MG tablet Take 1 tablet (10 mg total) by mouth daily. 90 tablet 3  . fluticasone (FLONASE) 50 MCG/ACT nasal spray USE TWO SPRAYS IN ONE NOSTRIL ONCE DAILY. 16 g 0  . metFORMIN (GLUCOPHAGE-XR) 500 MG 24 hr tablet Take 1 tablet (500 mg total) by mouth daily  with breakfast. 90 tablet 3   No current facility-administered medications on file prior to visit.    Review of Systems Constitutional: Negative for other unusual diaphoresis, sweats, appetite or weight changes HENT: Negative for other worsening hearing loss, ear pain, facial swelling, mouth sores or neck stiffness.   Eyes: Negative for other worsening pain, redness or other visual disturbance.  Respiratory: Negative for other stridor or swelling Cardiovascular: Negative for other palpitations or other chest pain  Gastrointestinal: Negative for worsening diarrhea or loose stools, blood in stool, distention or other pain Genitourinary: Negative for hematuria, flank pain or other change in urine volume.  Musculoskeletal: Negative for myalgias or other joint swelling.  Skin: Negative for other color change, or other wound or worsening drainage.  Neurological: Negative for other syncope or numbness. Hematological: Negative for other adenopathy or swelling Psychiatric/Behavioral: Negative for hallucinations, other worsening agitation, SI, self-injury, or new decreased concentration Al other system neg per pt    Objective:   Physical Exam BP 124/78   Pulse 81   Temp 97.9 F (36.6 C) (Oral)   Ht 5\' 11"  (1.803 m)   Wt 259 lb (117.5 kg)   SpO2 96%   BMI 36.12 kg/m  VS noted,  Constitutional: Pt is oriented to person, place, and time. Appears well-developed and well-nourished, in no significant distress and comfortable Head: Normocephalic and atraumatic  Eyes: Conjunctivae and EOM are normal. Pupils are equal, round, and reactive to light Right Ear: External ear normal without discharge  Left Ear: External ear normal without discharge Nose: Nose without discharge or deformity Mouth/Throat: Oropharynx is without other ulcerations and moist  Neck: Normal range of motion. Neck supple. No JVD present. No tracheal deviation present or significant neck LA or mass Cardiovascular: Normal rate,  regular rhythm, normal heart sounds and intact distal pulses.   Pulmonary/Chest: WOB normal and breath sounds without rales or wheezing  Abdominal: Soft. Bowel sounds are normal. NT. No HSM  Musculoskeletal: Normal range of motion. Exhibits no edema Lymphadenopathy: Has no other cervical adenopathy.  Neurological: Pt is alert and oriented to person, place, and time. Pt has normal reflexes. No cranial nerve deficit. Motor grossly intact, Gait intact Skin: Skin is warm and dry. No rash noted or new ulcerations Psychiatric:  Has normal mood and affect. Behavior is normal without agitation No other exam findings Lab Results  Component Value Date   WBC 7.6 09/24/2018   HGB 16.3 09/24/2018   HCT 47.9 09/24/2018   PLT 203.0 09/24/2018   GLUCOSE 170 (H) 09/24/2018   CHOL 164 09/24/2018   TRIG 174.0 (H) 09/24/2018   HDL 40.60 09/24/2018   LDLCALC 89 09/24/2018   ALT 74 (H) 09/24/2018   AST 37 09/24/2018   NA 138 09/24/2018   K 4.4 09/24/2018   CL 102 09/24/2018   CREATININE 0.98 09/24/2018   BUN 14 09/24/2018   CO2 26 09/24/2018   TSH 1.36 09/24/2018   PSA 0.61 09/24/2018   HGBA1C 8.1 (H) 09/24/2018   MICROALBUR <0.7 09/24/2018       Assessment & Plan:

## 2018-09-24 NOTE — Patient Instructions (Signed)
Please continue all other medications as before, and refills have been done if requested.  Please have the pharmacy call with any other refills you may need.  Please continue your efforts at being more active, low cholesterol diet, and weight control.  You are otherwise up to date with prevention measures today.  Please keep your appointments with your specialists as you may have planned  You will be contacted regarding the referral for: eye doctor  Please go to the LAB in the Basement (turn left off the elevator) for the tests to be done today  You will be contacted by phone if any changes need to be made immediately.  Otherwise, you will receive a letter about your results with an explanation, but please check with MyChart first.  Please remember to sign up for MyChart if you have not done so, as this will be important to you in the future with finding out test results, communicating by private email, and scheduling acute appointments online when needed.  Please return in 6 months, or sooner if needed, with Lab testing done 3-5 days before  

## 2018-09-25 ENCOUNTER — Encounter: Payer: Self-pay | Admitting: Internal Medicine

## 2018-09-25 NOTE — Assessment & Plan Note (Signed)
stable overall by history and exam, recent data reviewed with pt, and pt to continue medical treatment as before,  to f/u any worsening symptoms or concerns  

## 2018-09-25 NOTE — Assessment & Plan Note (Signed)

## 2018-09-27 ENCOUNTER — Other Ambulatory Visit: Payer: Self-pay | Admitting: Internal Medicine

## 2018-09-27 ENCOUNTER — Telehealth: Payer: Self-pay

## 2018-09-27 LAB — HEPATITIS C ANTIBODY
Hepatitis C Ab: NONREACTIVE
SIGNAL TO CUT-OFF: 0.03 (ref <1.00)

## 2018-09-27 LAB — HIV ANTIBODY (ROUTINE TESTING W REFLEX): HIV 1&2 Ab, 4th Generation: NONREACTIVE

## 2018-09-27 MED ORDER — VITAMIN D (ERGOCALCIFEROL) 1.25 MG (50000 UNIT) PO CAPS: 50000.0000 [IU] | ORAL_CAPSULE | ORAL | 0 refills | Status: DC

## 2018-09-27 MED ORDER — METFORMIN HCL ER 500 MG PO TB24: 500.0000 mg | ORAL_TABLET | Freq: Two times a day (BID) | ORAL | 3 refills | Status: DC

## 2018-09-27 NOTE — Telephone Encounter (Signed)
-----   Message from Biagio Borg, MD sent at 09/27/2018  6:44 AM EDT ----- Left message on MyChart, pt to cont same tx except  The test results show that your current treatment is OK, as the tests are stable except for a low Vitamin D level, and A1c elevated at 8.1.  We should increase the metformin to 500 mg twice per day.  I will send these prescriptions, and you should hear from the office as well.  Brendan Washington to please inform pt, I will do rx x2

## 2018-09-27 NOTE — Telephone Encounter (Signed)
Attempted to call pt. Line was silent.   CRM created.

## 2018-09-28 ENCOUNTER — Other Ambulatory Visit: Payer: Self-pay | Admitting: *Deleted

## 2018-09-28 MED ORDER — ATORVASTATIN CALCIUM 10 MG PO TABS: 10.0000 mg | ORAL_TABLET | Freq: Every day | ORAL | 0 refills | Status: DC

## 2018-09-28 MED ORDER — VITAMIN D (ERGOCALCIFEROL) 1.25 MG (50000 UNIT) PO CAPS: 50000.0000 [IU] | ORAL_CAPSULE | ORAL | 0 refills | Status: DC

## 2018-12-22 ENCOUNTER — Other Ambulatory Visit: Payer: Self-pay | Admitting: Internal Medicine

## 2018-12-22 MED ORDER — ATORVASTATIN CALCIUM 10 MG PO TABS: 10.0000 mg | ORAL_TABLET | Freq: Every day | ORAL | 2 refills | Status: DC

## 2019-04-01 ENCOUNTER — Ambulatory Visit: Payer: No Typology Code available for payment source | Admitting: Internal Medicine

## 2019-05-18 ENCOUNTER — Other Ambulatory Visit: Payer: Self-pay

## 2019-05-18 ENCOUNTER — Encounter: Payer: Self-pay | Admitting: Internal Medicine

## 2019-05-18 ENCOUNTER — Ambulatory Visit (INDEPENDENT_AMBULATORY_CARE_PROVIDER_SITE_OTHER): Payer: No Typology Code available for payment source | Admitting: Internal Medicine

## 2019-05-18 VITALS — BP 122/76 | HR 82 | Temp 98.0°F | Ht 71.0 in | Wt 255.2 lb

## 2019-05-18 DIAGNOSIS — E559 Vitamin D deficiency, unspecified: Secondary | ICD-10-CM

## 2019-05-18 DIAGNOSIS — Z0001 Encounter for general adult medical examination with abnormal findings: Secondary | ICD-10-CM | POA: Diagnosis not present

## 2019-05-18 DIAGNOSIS — Z125 Encounter for screening for malignant neoplasm of prostate: Secondary | ICD-10-CM

## 2019-05-18 DIAGNOSIS — E611 Iron deficiency: Secondary | ICD-10-CM

## 2019-05-18 DIAGNOSIS — B3789 Other sites of candidiasis: Secondary | ICD-10-CM | POA: Diagnosis not present

## 2019-05-18 DIAGNOSIS — Z0000 Encounter for general adult medical examination without abnormal findings: Secondary | ICD-10-CM | POA: Diagnosis not present

## 2019-05-18 DIAGNOSIS — E785 Hyperlipidemia, unspecified: Secondary | ICD-10-CM | POA: Diagnosis not present

## 2019-05-18 DIAGNOSIS — E119 Type 2 diabetes mellitus without complications: Secondary | ICD-10-CM

## 2019-05-18 DIAGNOSIS — E538 Deficiency of other specified B group vitamins: Secondary | ICD-10-CM

## 2019-05-18 DIAGNOSIS — I10 Essential (primary) hypertension: Secondary | ICD-10-CM

## 2019-05-18 MED ORDER — ROSUVASTATIN CALCIUM 20 MG PO TABS: 20.0000 mg | ORAL_TABLET | Freq: Every day | ORAL | 3 refills | Status: DC

## 2019-05-18 MED ORDER — FLUCONAZOLE 100 MG PO TABS: 100.0000 mg | ORAL_TABLET | Freq: Every day | ORAL | 0 refills | Status: AC

## 2019-05-18 MED ORDER — KETOCONAZOLE 2 % EX CREA: 1.0000 "application " | TOPICAL_CREAM | Freq: Every day | CUTANEOUS | 2 refills | Status: DC | PRN

## 2019-05-18 NOTE — Assessment & Plan Note (Addendum)
Mild to mod, for antibx course,  to f/u any worsening symptoms or concerns  I spent 31 minutes in preparing to see the patient by review of recent labs, imaging and procedures, obtaining and reviewing separately obtained history, communicating with the patient and family or caregiver, ordering medications, tests or procedures, and documenting clinical information in the EHR including the differential Dx, treatment, and any further evaluation and other management of candida rash, DM, HTn, HLD \

## 2019-05-18 NOTE — Patient Instructions (Signed)
Please take all new medication as prescribed - the pills for 1 wk, then the cream if needed  Ok to change the lipitor to crestor  Please continue all other medications as before, and refills have been done if requested.  Please have the pharmacy call with any other refills you may need.  Please continue your efforts at being more active, low cholesterol diet, and weight control.  You are otherwise up to date with prevention measures today.  Please keep your appointments with your specialists as you may have planned  Your blood work was done today  You will be contacted by phone if any changes need to be made immediately.  Otherwise, you will receive a letter about your results with an explanation, but please check with MyChart first.  Please remember to sign up for MyChart if you have not done so, as this will be important to you in the future with finding out test results, communicating by private email, and scheduling acute appointments online when needed.  Please make an Appointment to return in 6 months, or sooner if needed

## 2019-05-18 NOTE — Assessment & Plan Note (Signed)
stable overall by history and exam, recent data reviewed with pt, and pt to continue medical treatment as before,  to f/u any worsening symptoms or concerns  

## 2019-05-18 NOTE — Assessment & Plan Note (Signed)
Ok for change to crestor, cont lower chol diet

## 2019-05-18 NOTE — Assessment & Plan Note (Signed)

## 2019-05-18 NOTE — Progress Notes (Signed)
Subjective:    Patient ID: Brendan Washington, male    DOB: 1960-09-20, 59 y.o.   MRN: [id number]  HPI  Here for wellness and f/u;  Overall doing ok;  Pt denies Chest pain, worsening SOB, DOE, wheezing, orthopnea, PND, worsening LE edema, palpitations, dizziness or syncope.  Pt denies neurological change such as new headache, facial or extremity weakness.  Pt denies polydipsia, polyuria, or low sugar symptoms. Pt states overall good compliance with treatment and medications, good tolerability, and has been trying to follow appropriate diet.  Pt denies worsening depressive symptoms, suicidal ideation or panic. No fever, night sweats, wt loss, loss of appetite, or other constitutional symptoms.  Pt states good ability with ADL's, has low fall risk, home safety reviewed and adequate, no other significant changes in hearing or vision, and only occasionally active with exercise. Also with bilateral itchy large red red but nontender bialteral groin area without fever or swelling or drain.  Asks to change lipitor to crestor due to ongoing leg cramps.   Past Medical History:  Diagnosis Date  . Hypercholesteremia   . Hyperglycemia 07/31/2015  . Hyperlipidemia 07/31/2015   Past Surgical History:  Procedure Laterality Date  . BILATERAL CARPAL TUNNEL RELEASE    . CARPAL TUNNEL WITH CUBITAL TUNNEL      reports that he quit smoking about 24 years ago. He has never used smokeless tobacco. No history on file for alcohol and drug. family history includes Heart disease in his father; Parkinson's disease in his mother. Allergies  Allergen Reactions  . Penicillins   . Tetanus Toxoid     REACTION: arm swelling   Current Outpatient Medications on File Prior to Visit  Medication Sig Dispense Refill  . aspirin EC 81 MG tablet Take 1 tablet (81 mg total) by mouth daily. 90 tablet 11  . fluticasone (FLONASE) 50 MCG/ACT nasal spray USE TWO SPRAYS IN ONE NOSTRIL ONCE DAILY. 16 g 0  . metFORMIN (GLUCOPHAGE-XR) 500 MG  24 hr tablet Take 1 tablet (500 mg total) by mouth 2 (two) times a day. (Patient not taking: Reported on 05/18/2019) 180 tablet 3   No current facility-administered medications on file prior to visit.   Review of Systems All otherwise neg per pt     Objective:   Physical Exam BP 122/76   Pulse 82   Temp 98 F (36.7 C)   Ht 5\' 11"  (1.803 m)   Wt 255 lb 3.2 oz (115.8 kg)   SpO2 99%   BMI 35.59 kg/m  VS noted,  Constitutional: Pt appears in NAD HENT: Head: NCAT.  Right Ear: External ear normal.  Left Ear: External ear normal.  Eyes: . Pupils are equal, round, and reactive to light. Conjunctivae and EOM are normal Nose: without d/c or deformity Neck: Neck supple. Gross normal ROM Cardiovascular: Normal rate and regular rhythm.   Pulmonary/Chest: Effort normal and breath sounds without rales or wheezing.  Abd:  Soft, NT, ND, + BS, no organomegaly Neurological: Pt is alert. At baseline orientation, motor grossly intact Skin: Skin is warm. + nontender candida rash to the bilateral groins, no other new lesions, no LE edema Psychiatric: Pt behavior is normal without agitation  All otherwise neg per pt Lab Results  Component Value Date   WBC 7.6 09/24/2018   HGB 16.3 09/24/2018   HCT 47.9 09/24/2018   PLT 203.0 09/24/2018   GLUCOSE 170 (H) 09/24/2018   CHOL 164 09/24/2018   TRIG 174.0 (H) 09/24/2018   HDL  40.60 09/24/2018   LDLCALC 89 09/24/2018   ALT 74 (H) 09/24/2018   AST 37 09/24/2018   NA 138 09/24/2018   K 4.4 09/24/2018   CL 102 09/24/2018   CREATININE 0.98 09/24/2018   BUN 14 09/24/2018   CO2 26 09/24/2018   TSH 1.36 09/24/2018   PSA 0.61 09/24/2018   HGBA1C 8.1 (H) 09/24/2018   MICROALBUR <0.7 09/24/2018      Assessment & Plan:

## 2019-05-19 ENCOUNTER — Other Ambulatory Visit: Payer: Self-pay | Admitting: Internal Medicine

## 2019-05-19 LAB — LIPID PANEL
Cholesterol: 117 mg/dL (ref 0–200)
HDL: 41.3 mg/dL
LDL Cholesterol: 55 mg/dL (ref 0–99)
NonHDL: 76.04
Total CHOL/HDL Ratio: 3
Triglycerides: 104 mg/dL (ref 0.0–149.0)
VLDL: 20.8 mg/dL (ref 0.0–40.0)

## 2019-05-19 LAB — URINALYSIS, ROUTINE W REFLEX MICROSCOPIC
Bilirubin Urine: NEGATIVE
Hgb urine dipstick: NEGATIVE
Ketones, ur: NEGATIVE
Leukocytes,Ua: NEGATIVE
Nitrite: NEGATIVE
RBC / HPF: NONE SEEN
Specific Gravity, Urine: 1.025 (ref 1.000–1.030)
Total Protein, Urine: NEGATIVE
Urine Glucose: NEGATIVE
Urobilinogen, UA: 0.2 (ref 0.0–1.0)
pH: 5.5 (ref 5.0–8.0)

## 2019-05-19 LAB — HEPATIC FUNCTION PANEL
ALT: 50 U/L (ref 0–53)
AST: 31 U/L (ref 0–37)
Albumin: 4.1 g/dL (ref 3.5–5.2)
Alkaline Phosphatase: 62 U/L (ref 39–117)
Bilirubin, Direct: 0.2 mg/dL (ref 0.0–0.3)
Total Bilirubin: 0.8 mg/dL (ref 0.2–1.2)
Total Protein: 7 g/dL (ref 6.0–8.3)

## 2019-05-19 LAB — BASIC METABOLIC PANEL WITH GFR
BUN: 12 mg/dL (ref 6–23)
CO2: 29 meq/L (ref 19–32)
Calcium: 9.1 mg/dL (ref 8.4–10.5)
Chloride: 102 meq/L (ref 96–112)
Creatinine, Ser: 0.99 mg/dL (ref 0.40–1.50)
GFR: 77.94 mL/min
Glucose, Bld: 105 mg/dL — ABNORMAL HIGH (ref 70–99)
Potassium: 3.6 meq/L (ref 3.5–5.1)
Sodium: 137 meq/L (ref 135–145)

## 2019-05-19 LAB — CBC WITH DIFFERENTIAL/PLATELET
Basophils Absolute: 0.1 K/uL (ref 0.0–0.1)
Basophils Relative: 1 % (ref 0.0–3.0)
Eosinophils Absolute: 0.2 K/uL (ref 0.0–0.7)
Eosinophils Relative: 1.9 % (ref 0.0–5.0)
HCT: 45 % (ref 39.0–52.0)
Hemoglobin: 15.5 g/dL (ref 13.0–17.0)
Lymphocytes Relative: 38.6 % (ref 12.0–46.0)
Lymphs Abs: 3.7 K/uL (ref 0.7–4.0)
MCHC: 34.4 g/dL (ref 30.0–36.0)
MCV: 95.1 fl (ref 78.0–100.0)
Monocytes Absolute: 0.7 K/uL (ref 0.1–1.0)
Monocytes Relative: 6.9 % (ref 3.0–12.0)
Neutro Abs: 5 K/uL (ref 1.4–7.7)
Neutrophils Relative %: 51.6 % (ref 43.0–77.0)
Platelets: 227 K/uL (ref 150.0–400.0)
RBC: 4.74 Mil/uL (ref 4.22–5.81)
RDW: 12.6 % (ref 11.5–15.5)
WBC: 9.7 K/uL (ref 4.0–10.5)

## 2019-05-19 LAB — VITAMIN D 25 HYDROXY (VIT D DEFICIENCY, FRACTURES): VITD: 30.03 ng/mL (ref 30.00–100.00)

## 2019-05-19 LAB — MICROALBUMIN / CREATININE URINE RATIO
Creatinine,U: 84.5 mg/dL
Microalb Creat Ratio: 0.8 mg/g (ref 0.0–30.0)
Microalb, Ur: <0.7 mg/dL (ref 0.0–1.9)

## 2019-05-19 LAB — HEMOGLOBIN A1C: Hgb A1c MFr Bld: 7.4 % — ABNORMAL HIGH (ref 4.6–6.5)

## 2019-05-19 LAB — IBC PANEL
Iron: 86 ug/dL (ref 42–165)
Saturation Ratios: 29 % (ref 20.0–50.0)
Transferrin: 212 mg/dL (ref 212.0–360.0)

## 2019-05-19 LAB — PSA: PSA: 0.6 ng/mL (ref 0.10–4.00)

## 2019-05-19 LAB — VITAMIN B12: Vitamin B-12: 418 pg/mL (ref 211–911)

## 2019-05-19 LAB — TSH: TSH: 1.58 u[IU]/mL (ref 0.35–4.50)

## 2019-05-19 MED ORDER — METFORMIN HCL ER 500 MG PO TB24: 1500.0000 mg | ORAL_TABLET | Freq: Every day | ORAL | 3 refills | Status: DC

## 2019-06-13 ENCOUNTER — Other Ambulatory Visit: Payer: Self-pay | Admitting: Internal Medicine

## 2019-06-13 MED ORDER — COOL BLOOD GLUCOSE TEST STRIPS VI STRP: ORAL_STRIP | 12 refills | Status: AC

## 2019-06-13 NOTE — Telephone Encounter (Signed)
New message:   1.Medication Requested: Relion Prime test strips 2. Pharmacy (Name, Street, [place]): [id number] - [place], [place] - [address] 3. On Med List: yes  4. Last Visit with PCP:   5. Next visit date with PCP:   Agent: Please be advised that RX refills may take up to 3 business days. We ask that you follow-up with your pharmacy.

## 2019-06-13 NOTE — Telephone Encounter (Signed)
erx sent as requested.  

## 2019-10-05 ENCOUNTER — Encounter: Payer: Self-pay | Admitting: Internal Medicine

## 2019-10-05 ENCOUNTER — Ambulatory Visit (INDEPENDENT_AMBULATORY_CARE_PROVIDER_SITE_OTHER): Payer: No Typology Code available for payment source | Admitting: Internal Medicine

## 2019-10-05 ENCOUNTER — Other Ambulatory Visit: Payer: Self-pay

## 2019-10-05 VITALS — BP 136/72 | HR 54 | Temp 98.7°F | Ht 71.0 in | Wt 255.0 lb

## 2019-10-05 DIAGNOSIS — I10 Essential (primary) hypertension: Secondary | ICD-10-CM

## 2019-10-05 DIAGNOSIS — B3789 Other sites of candidiasis: Secondary | ICD-10-CM

## 2019-10-05 DIAGNOSIS — E119 Type 2 diabetes mellitus without complications: Secondary | ICD-10-CM

## 2019-10-05 DIAGNOSIS — E785 Hyperlipidemia, unspecified: Secondary | ICD-10-CM | POA: Diagnosis not present

## 2019-10-05 MED ORDER — OZEMPIC (0.25 OR 0.5 MG/DOSE) 2 MG/1.5ML ~~LOC~~ SOPN: 0.5000 mg | PEN_INJECTOR | SUBCUTANEOUS | 11 refills | Status: DC

## 2019-10-05 MED ORDER — FLUCONAZOLE 100 MG PO TABS: 100.0000 mg | ORAL_TABLET | Freq: Every day | ORAL | 0 refills | Status: DC

## 2019-10-05 NOTE — Assessment & Plan Note (Signed)
Mild to mod, for antibx course,  to f/u any worsening symptoms or concerns 

## 2019-10-05 NOTE — Patient Instructions (Addendum)
Your A1c was:  7.7  Please take all new medication as prescribed - the ozempic, and the diflucan for the rash  Please continue all other medications as before, and refills have been done if requested.  Please have the pharmacy call with any other refills you may need.  Please continue your efforts at being more active, low cholesterol diet, and weight control..  Please keep your appointments with your specialists as you may have planned  Please make an Appointment to return in 3 months, or sooner if needed

## 2019-10-05 NOTE — Assessment & Plan Note (Signed)
stable overall by history and exam, recent data reviewed with pt, and pt to continue medical treatment as before,  to f/u any worsening symptoms or concerns  

## 2019-10-05 NOTE — Progress Notes (Signed)
Subjective:    Patient ID: Brendan Washington, male    DOB: 1960/05/18, 59 y.o.   MRN: [id number]  HPI  Here to f/u; overall doing ok,  Pt denies chest pain, increasing sob or doe, wheezing, orthopnea, PND, increased LE swelling, palpitations, dizziness or syncope.  Pt denies new neurological symptoms such as new headache, or facial or extremity weakness or numbness.  Pt denies polydipsia, polyuria, or low sugar episode.  Pt states overall good compliance with meds, mostly trying to follow appropriate diet, with wt overall stable,  but little exercise however. Has recurring fungal groin rash with sweating at work, worse in the past wk again [facility] Readings from Last 3 Encounters:  10/05/19 255 lb (115.7 kg)  05/18/19 255 lb 3.2 oz (115.8 kg)  09/24/18 259 lb (117.5 kg)   Past Medical History:  Diagnosis Date  . Hypercholesteremia   . Hyperglycemia 07/31/2015  . Hyperlipidemia 07/31/2015   Past Surgical History:  Procedure Laterality Date  . BILATERAL CARPAL TUNNEL RELEASE    . CARPAL TUNNEL WITH CUBITAL TUNNEL      reports that he quit smoking about 24 years ago. He has never used smokeless tobacco. No history on file for alcohol use and drug use. family history includes Heart disease in his father; Parkinson's disease in his mother. Allergies  Allergen Reactions  . Penicillins   . Tetanus Toxoid     REACTION: arm swelling   Current Outpatient Medications on File Prior to Visit  Medication Sig Dispense Refill  . aspirin EC 81 MG tablet Take 1 tablet (81 mg total) by mouth daily. 90 tablet 11  . fluticasone (FLONASE) 50 MCG/ACT nasal spray USE TWO SPRAYS IN ONE NOSTRIL ONCE DAILY. 16 g 0  . glucose blood (COOL BLOOD GLUCOSE TEST STRIPS) test strip Use as instructed to check blood sugar daily. DX: E11.9 100 each 12  . ketoconazole (NIZORAL) 2 % cream Apply 1 application topically daily as needed for irritation. 30 g 2  . metFORMIN (GLUCOPHAGE-XR) 500 MG 24 hr tablet Take 3 tablets (1,500  mg total) by mouth daily with breakfast. 270 tablet 3  . rosuvastatin (CRESTOR) 20 MG tablet Take 1 tablet (20 mg total) by mouth daily. 90 tablet 3   No current facility-administered medications on file prior to visit.   Review of Systems All otherwise neg per pt    Objective:   Physical Exam BP 136/72 (BP Location: Left Arm, Patient Position: Sitting, Cuff Size: Large)   Pulse (!) 54   Temp 98.7 F (37.1 C) (Oral)   Ht 5\' 11"  (1.803 m)   Wt 255 lb (115.7 kg)   SpO2 96%   BMI 35.57 kg/m  VS noted,  Constitutional: Pt appears in NAD HENT: Head: NCAT.  Right Ear: External ear normal.  Left Ear: External ear normal.  Eyes: . Pupils are equal, round, and reactive to light. Conjunctivae and EOM are normal Nose: without d/c or deformity Neck: Neck supple. Gross normal ROM Cardiovascular: Normal rate and regular rhythm.   Pulmonary/Chest: Effort normal and breath sounds without rales or wheezing.  Abd:  Soft, NT, ND, + BS, no organomegaly Neurological: Pt is alert. At baseline orientation, motor grossly intact Skin: Skin is warm. No rashes, other new lesions, no LE edema Psychiatric: Pt behavior is normal without agitation  All otherwise neg per pt Lab Results  Component Value Date   WBC 9.7 05/18/2019   HGB 15.5 05/18/2019   HCT 45.0 05/18/2019   PLT  227.0 05/18/2019   GLUCOSE 105 (H) 05/18/2019   CHOL 117 05/18/2019   TRIG 104.0 05/18/2019   HDL 41.30 05/18/2019   LDLCALC 55 05/18/2019   ALT 50 05/18/2019   AST 31 05/18/2019   NA 137 05/18/2019   K 3.6 05/18/2019   CL 102 05/18/2019   CREATININE 0.99 05/18/2019   BUN 12 05/18/2019   CO2 29 05/18/2019   TSH 1.58 05/18/2019   PSA 0.60 05/18/2019   HGBA1C 7.4 (H) 05/18/2019   MICROALBUR <0.7 05/18/2019   Hgba1c - today POCT - 7.7       Assessment & Plan:

## 2019-10-05 NOTE — Assessment & Plan Note (Addendum)
Unocntrolled, for ad ozempic to tx sugar and help wt loss as well  I spent 31 minutes in preparing to see the patient by review of recent labs, imaging and procedures, obtaining and reviewing separately obtained history, communicating with the patient and family or caregiver, ordering medications, tests or procedures, and documenting clinical information in the EHR including the differential Dx, treatment, and any further evaluation and other management of dm, htn, hld, rash

## 2019-10-10 ENCOUNTER — Telehealth (INDEPENDENT_AMBULATORY_CARE_PROVIDER_SITE_OTHER): Payer: Self-pay

## 2019-10-10 ENCOUNTER — Telehealth: Payer: Self-pay | Admitting: Pharmacist

## 2019-10-10 NOTE — Progress Notes (Signed)
Ozempic samples received for patient - 2 boxes stored in fridge ready for patient to pick up.

## 2019-10-10 NOTE — Telephone Encounter (Signed)
Brendan Washington came today to pick the ozempic samples up.  Also dropped off patient assistance program forms to be filled out for ozempic. Would like faxed once complete and called to pick up original forms. Would also like to know if Dr Jonny Ruiz would like for him to continue taking the metformin once he starts taking ozempic? Please advise.   CB#: [phone]

## 2019-10-11 NOTE — Telephone Encounter (Signed)
Yes to continue the metformin

## 2019-10-12 ENCOUNTER — Encounter (INDEPENDENT_AMBULATORY_CARE_PROVIDER_SITE_OTHER): Payer: Self-pay

## 2019-10-12 NOTE — Progress Notes (Unsigned)
Note for DOT compliance of C-pap.

## 2019-10-13 NOTE — Telephone Encounter (Signed)
Spoke with Marylene Land and informed her of dr. Raphael Gibney note for pt to continue taking his Metformin.

## 2019-11-14 ENCOUNTER — Telehealth: Payer: Self-pay | Admitting: Internal Medicine

## 2019-11-14 NOTE — Telephone Encounter (Signed)
Patient is requesting a refill on the following medication. The skin rash as came back.  fluconazole (DIFLUCAN) 100 MG tablet  [facility] [address], [place] - [address]  [address] [place], [place] [address]  Phone:  [phone] Fax:  [phone]

## 2019-11-17 ENCOUNTER — Telehealth: Payer: Self-pay | Admitting: Internal Medicine

## 2019-11-17 MED ORDER — FLUCONAZOLE 100 MG PO TABS: 100.0000 mg | ORAL_TABLET | Freq: Every day | ORAL | 0 refills | Status: DC

## 2019-11-17 NOTE — Telephone Encounter (Signed)
Done erx  But let pt know this is not normally a frequently refillable medication (the pill form) due to the risk of the medication  After this, the cream will need to be used to control, thanks

## 2019-11-21 NOTE — Telephone Encounter (Signed)
LDVM for of Dr. Jonny Ruiz' note.

## 2019-12-20 ENCOUNTER — Telehealth (INDEPENDENT_AMBULATORY_CARE_PROVIDER_SITE_OTHER): Payer: No Typology Code available for payment source | Admitting: Family Medicine

## 2019-12-20 DIAGNOSIS — Z7185 Encounter for immunization safety counseling: Secondary | ICD-10-CM | POA: Diagnosis not present

## 2019-12-20 DIAGNOSIS — R509 Fever, unspecified: Secondary | ICD-10-CM

## 2019-12-20 DIAGNOSIS — K921 Melena: Secondary | ICD-10-CM | POA: Diagnosis not present

## 2019-12-20 DIAGNOSIS — R109 Unspecified abdominal pain: Secondary | ICD-10-CM | POA: Diagnosis not present

## 2019-12-20 NOTE — Progress Notes (Signed)
Virtual Visit via Video Note  I connected with Brendan Washington  on 12/20/19 at 12:20 PM EDT by a video enabled telemedicine application and verified that I am speaking with the correct person using two identifiers.  Location patient: home Location provider:work or home office Persons participating in the virtual visit: patient, provider  I discussed the limitations of evaluation and management by telemedicine and the availability of in person appointments. The patient expressed understanding and agreed to proceed.   HPI:  Acute telemedicine visit for flu like illness: -Onset: 3 days ago when returning from a fishing trip -Symptoms include: diarrhea, nausea, abd pain, chills, feels tired, HA, fever -took a zpack last week for ? bronchitis -still having diarrhea and today and now stools are "coal black" -101 fever -Denies: vomiting, cough, sinus congestion, CP, SOB, inability to get out of bed/eat/drink -has been around a lot of people the last 4-5 days -Has tried: one dose of pepto initially -Pertinent past medical history: DM -Pertinent medication allergies: see allergies -COVID-19 vaccine status: not vaccinated for covid  ROS: See pertinent positives and negatives per HPI.  Past Medical History:  Diagnosis Date  . Hypercholesteremia   . Hyperglycemia 07/31/2015  . Hyperlipidemia 07/31/2015    Past Surgical History:  Procedure Laterality Date  . BILATERAL CARPAL TUNNEL RELEASE    . CARPAL TUNNEL WITH CUBITAL TUNNEL       Current Outpatient Medications:  .  aspirin EC 81 MG tablet, Take 1 tablet (81 mg total) by mouth daily., Disp: 90 tablet, Rfl: 11 .  fluconazole (DIFLUCAN) 100 MG tablet, Take 1 tablet (100 mg total) by mouth daily., Disp: 7 tablet, Rfl: 0 .  fluticasone (FLONASE) 50 MCG/ACT nasal spray, USE TWO SPRAYS IN ONE NOSTRIL ONCE DAILY., Disp: 16 g, Rfl: 0 .  glucose blood (COOL BLOOD GLUCOSE TEST STRIPS) test strip, Use as instructed to check blood sugar daily. DX:  E11.9, Disp: 100 each, Rfl: 12 .  ketoconazole (NIZORAL) 2 % cream, Apply 1 application topically daily as needed for irritation., Disp: 30 g, Rfl: 2 .  metFORMIN (GLUCOPHAGE-XR) 500 MG 24 hr tablet, Take 3 tablets (1,500 mg total) by mouth daily with breakfast., Disp: 270 tablet, Rfl: 3 .  rosuvastatin (CRESTOR) 20 MG tablet, Take 1 tablet (20 mg total) by mouth daily., Disp: 90 tablet, Rfl: 3 .  Semaglutide,0.25 or 0.5MG /DOS, (OZEMPIC, 0.25 OR 0.5 MG/DOSE,) 2 MG/1.5ML SOPN, Inject 0.375 mLs (0.5 mg total) into the skin once a week., Disp: 1.5 mL, Rfl: 11  EXAM:  VITALS per patient if applicable:  GENERAL: alert, oriented, appears to feel poorly, is laying under blankets and does not look well  HEENT: atraumatic, conjunttiva clear, no obvious abnormalities on inspection of external nose and ears  NECK: normal movements of the head and neck  LUNGS: on inspection no signs of respiratory distress, breathing rate appears normal, no obvious gross SOB, gasping or wheezing  CV: no obvious cyanosis  MS: moves all visible extremities without noticeable abnormality  PSYCH/NEURO: pleasant and cooperative, no obvious depression or anxiety, speech and thought processing grossly intact  ASSESSMENT AND PLAN:  Discussed the following assessment and plan:  Abdominal pain, unspecified abdominal location  Fever, unspecified fever cause  Melena  Vaccine counseling  -we discussed possible serious and likely etiologies, options for evaluation and workup, limitations of telemedicine visit vs in person visit, treatment, treatment risks and precautions. Pt prefers to treat via telemedicine empirically rather than in person at this moment.  He does not  look well.  I am concerned with his symptoms.  Advised referral for in person evaluation.  Discussed possible Covid, possible colitis versus other.  He opted for an in person evaluation at a nearby urgent care local to him.  He agrees to go promptly after  this visit.  He reports he feels well enough to go by car and that his wife will drive him.  Advised if this is Covid, that he would qualify for antibody treatment.  Discussed this treatment and options and he agrees to request this from the urgent care if his Covid test is positive.  He also had questions about Covid vaccine.  Answered his questions.  He seems agreeable to getting the Covid vaccine when able pending evaluation of current issues.  Advised to seek prompt follow up telemedicine visit or in person care if worsening, new symptoms arise, or if is not improving with treatment. Did let this patient know that I only do telemedicine on Tuesdays and Thursdays for . Advised to schedule follow up visit with PCP or [facility] if any further questions or concerns to avoid delays in care.   I discussed the assessment and treatment plan with the patient. The patient was provided an opportunity to ask questions and all were answered. The patient agreed with the plan and demonstrated an understanding of the instructions.     Terressa Koyanagi, DO

## 2019-12-22 ENCOUNTER — Telehealth: Payer: Self-pay | Admitting: Internal Medicine

## 2019-12-22 DIAGNOSIS — R195 Other fecal abnormalities: Secondary | ICD-10-CM

## 2019-12-22 NOTE — Telephone Encounter (Signed)
Sent to Dr. John. 

## 2019-12-22 NOTE — Telephone Encounter (Signed)
Patient went to urgent care after his visit with Dr. Selena Batten on 12/20/19 and found out he has blood in his stool and they wanted him to see a GI doctor and wanted to see if he could get a referral.

## 2019-12-22 NOTE — Telephone Encounter (Signed)
Ok this is done 

## 2019-12-26 ENCOUNTER — Telehealth: Payer: Self-pay | Admitting: Internal Medicine

## 2019-12-26 NOTE — Telephone Encounter (Signed)
    Do you have samples of Semaglutide,0.25 or 0.5MG /DOS, (OZEMPIC, 0.25 OR 0.5 MG/DOSE,) 2 MG/1.5ML SOPN for patient

## 2019-12-30 ENCOUNTER — Other Ambulatory Visit: Payer: Self-pay

## 2019-12-30 MED ORDER — OZEMPIC (0.25 OR 0.5 MG/DOSE) 2 MG/1.5ML ~~LOC~~ SOPN: 0.5000 mg | PEN_INJECTOR | SUBCUTANEOUS | 11 refills | Status: DC

## 2020-01-10 ENCOUNTER — Ambulatory Visit: Payer: No Typology Code available for payment source | Admitting: Internal Medicine

## 2020-01-13 ENCOUNTER — Ambulatory Visit (INDEPENDENT_AMBULATORY_CARE_PROVIDER_SITE_OTHER): Payer: No Typology Code available for payment source | Admitting: Gastroenterology

## 2020-01-13 ENCOUNTER — Other Ambulatory Visit (INDEPENDENT_AMBULATORY_CARE_PROVIDER_SITE_OTHER): Payer: No Typology Code available for payment source

## 2020-01-13 ENCOUNTER — Encounter: Payer: Self-pay | Admitting: Gastroenterology

## 2020-01-13 VITALS — BP 118/78 | HR 93 | Ht 71.0 in | Wt 246.8 lb

## 2020-01-13 DIAGNOSIS — R195 Other fecal abnormalities: Secondary | ICD-10-CM | POA: Diagnosis not present

## 2020-01-13 DIAGNOSIS — K921 Melena: Secondary | ICD-10-CM

## 2020-01-13 LAB — CBC
HCT: 42 % (ref 39.0–52.0)
Hemoglobin: 14.4 g/dL (ref 13.0–17.0)
MCHC: 34.2 g/dL (ref 30.0–36.0)
MCV: 92.9 fl (ref 78.0–100.0)
Platelets: 249 10*3/uL (ref 150.0–400.0)
RBC: 4.52 Mil/uL (ref 4.22–5.81)
RDW: 12.8 % (ref 11.5–15.5)
WBC: 8.3 10*3/uL (ref 4.0–10.5)

## 2020-01-13 LAB — HEMOGLOBIN A1C: Hgb A1c MFr Bld: 7 % — ABNORMAL HIGH (ref 4.6–6.5)

## 2020-01-13 MED ORDER — OMEPRAZOLE 20 MG PO CPDR
20.0000 mg | DELAYED_RELEASE_CAPSULE | Freq: Every day | ORAL | 11 refills | Status: DC
Start: 2020-01-13 — End: 2021-09-16

## 2020-01-13 NOTE — Progress Notes (Signed)
HPI: This is a very pleasant 59 year old man who was referred to me by Corwin Levins, MD  to evaluate dark stools   About a month ago he had a mild diarrheal illness and vague epigastric discomfort for about 2 days and then the stool became melanotic.  He felt weak.  He felt fatigue.  He eventually went into an urgent clinic and was told that he was having GI bleed.  They did test his stool and it proved to have blood in it.  He tells me that his labs were not checked.  He is not sure if he was anemic.  He was given Zofran and over-the-counter strength omeprazole.  He has been on omeprazole 20 mg ever since then.  Leading into this he takes ibuprofen and Aleve each about once or twice monthly.  He does take 81 mg aspirin on a daily basis for family history of coronary artery disease.  His weight is down about 10 pounds since this acute illness.  After starting the omeprazole and Zofran his symptoms completely resolved.  He also drink 2 glasses of buttermilk.  Old Data Reviewed:  Cologuard colon cancer screening test August 2017 was negative.  Blood work March 2021 showed normal CBC   Review of systems: Pertinent positive and negative review of systems were noted in the above HPI section. All other review negative.   Past Medical History:  Diagnosis Date  . Hypercholesteremia   . Hyperglycemia 07/31/2015  . Hyperlipidemia 07/31/2015    Past Surgical History:  Procedure Laterality Date  . BILATERAL CARPAL TUNNEL RELEASE    . CARPAL TUNNEL WITH CUBITAL TUNNEL      Current Outpatient Medications  Medication Sig Dispense Refill  . Ascorbic Acid (VITAMIN C) 1000 MG tablet Take 1,000 mg by mouth daily.    Marland Kitchen aspirin EC 81 MG tablet Take 1 tablet (81 mg total) by mouth daily. 90 tablet 11  . B Complex-C (SUPER B COMPLEX PO) Take 1 tablet by mouth daily.    . Chromium 1000 MCG TABS Take 1 tablet by mouth daily.    Marland Kitchen CINNAMON PO Take 1,000 mg by mouth daily.    Marland Kitchen ELDERBERRY PO Take 1  tablet by mouth daily.    Marland Kitchen glucose blood (COOL BLOOD GLUCOSE TEST STRIPS) test strip Use as instructed to check blood sugar daily. DX: E11.9 100 each 12  . metFORMIN (GLUCOPHAGE-XR) 500 MG 24 hr tablet Take 3 tablets (1,500 mg total) by mouth daily with breakfast. (Patient taking differently: Take 500 mg by mouth 3 (three) times daily. ) 270 tablet 3  . NON FORMULARY Take 1 tablet by mouth daily. [facility]    . omeprazole (PRILOSEC) 20 MG capsule Take 20 mg by mouth daily.    Marland Kitchen pyridoxine (B-6) 100 MG tablet Take 100 mg by mouth daily.    . rosuvastatin (CRESTOR) 20 MG tablet Take 1 tablet (20 mg total) by mouth daily. 90 tablet 3  . Semaglutide,0.25 or 0.5MG /DOS, (OZEMPIC, 0.25 OR 0.5 MG/DOSE,) 2 MG/1.5ML SOPN Inject 0.5 mg into the skin once a week. 1.5 mL 11  . TURMERIC CURCUMIN PO Take 1,500 mg by mouth daily.    . vitamin B-12 (CYANOCOBALAMIN) 500 MCG tablet Take 500 mcg by mouth daily.     No current facility-administered medications for this visit.    Allergies as of 01/13/2020 - Review Complete 01/13/2020  Allergen Reaction Noted  . Penicillins  08/28/2008  . Tetanus toxoid  01/16/2010  Family History  Problem Relation Age of Onset  . Heart disease Father   . Kidney disease Father   . Stroke Father   . Parkinson's disease Mother   . Healthy Brother   . Colon cancer Neg Hx   . Stomach cancer Neg Hx   . Esophageal cancer Neg Hx   . Pancreatic cancer Neg Hx   . Liver disease Neg Hx     Social History   Socioeconomic History  . Marital status: Married    Spouse name: Not on file  . Number of children: Not on file  . Years of education: Not on file  . Highest education level: Not on file  Occupational History  . Not on file  Tobacco Use  . Smoking status: Former Smoker    Types: Cigarettes    Quit date: 03/03/1985    Years since quitting: 34.8  . Smokeless tobacco: Never Used  Vaping Use  . Vaping Use: Never used  Substance and Sexual Activity  .  Alcohol use: Yes    Comment: occ  . Drug use: Never  . Sexual activity: Not Currently  Other Topics Concern  . Not on file  Social History Narrative  . Not on file   Social Determinants of Health   Financial Resource Strain:   . Difficulty of Paying Living Expenses: Not on file  Food Insecurity:   . Worried About Programme researcher, broadcasting/film/video in the Last Year: Not on file  . Ran Out of Food in the Last Year: Not on file  Transportation Needs:   . Lack of Transportation (Medical): Not on file  . Lack of Transportation (Non-Medical): Not on file  Physical Activity:   . Days of Exercise per Week: Not on file  . Minutes of Exercise per Session: Not on file  Stress:   . Feeling of Stress : Not on file  Social Connections:   . Frequency of Communication with Friends and Family: Not on file  . Frequency of Social Gatherings with Friends and Family: Not on file  . Attends Religious Services: Not on file  . Active Member of Clubs or Organizations: Not on file  . Attends Banker Meetings: Not on file  . Marital Status: Not on file  Intimate Partner Violence:   . Fear of Current or Ex-Partner: Not on file  . Emotionally Abused: Not on file  . Physically Abused: Not on file  . Sexually Abused: Not on file     Physical Exam: BP 118/78   Pulse 93   Ht 5\' 11"  (1.803 m)   Wt 246 lb 12.8 oz (111.9 kg)   SpO2 96%   BMI 34.42 kg/m  Constitutional: generally well-appearing Psychiatric: alert and oriented x3 Eyes: extraocular movements intact Mouth: oral pharynx moist, no lesions Neck: supple no lymphadenopathy Cardiovascular: heart regular rate and rhythm Lungs: clear to auscultation bilaterally Abdomen: soft, nontender, nondistended, no obvious ascites, no peritoneal signs, normal bowel sounds Extremities: no lower extremity edema bilaterally Skin: no lesions on visible extremities   Assessment and plan: 59 y.o. male with recent GI bleeding, melenic stools  I explained  to him that he may have had peptic ulcer disease.  He has been taking aspirin on daily basis for many years.  He periodically takes Aleve and ibuprofen as well.  I explained right-sided: Lesion such as a cancer could cause similar symptoms.  I recommended blood tests including a CBC as well as invasive testing with EGD and colonoscopy.  Since 20 mg of omeprazole has completely resolved his symptoms I am going to write him a prescription for that same dose.  1 pill once daily.  He should probably stay on this until he no longer is on aspirin on a daily basis.  He is adamant about not undergoing a colonoscopy because he does not want to have to drink the prep for it and so we will just be doing upper endoscopy for him.  He understands that he changes his mind prior to the appointment date to let us know and we might be able to fit it in at the same time or we might have to change his appointment by a day or 2.  He asked that we check his hemoglobin A1c as it was due to be checked next week anyway.  I am happy to add that onto his CBC and I will forward the results to his primary care physician.  Please see the "Patient Instructions" section for addition details about the plan.   Rob Bunting, MD [facility] 01/13/2020, 10:22 AM  Cc: Corwin Levins, MD  Total time on date of encounter was  minutes (this included time spent preparing to see the patient reviewing records; obtaining and/or reviewing separately obtained history; performing a medically appropriate exam and/or evaluation; counseling and educating the patient and family if present; ordering medications, tests or procedures if applicable; and documenting clinical information in the health record).

## 2020-01-13 NOTE — Patient Instructions (Addendum)
If you are age 57 or older, your body mass index should be between 23-30. Your Body mass index is 34.42 kg/m. If this is out of the aforementioned range listed, please consider follow up with your Primary Care Provider.  If you are age 92 or younger, your body mass index should be between 19-25. Your Body mass index is 34.42 kg/m. If this is out of the aformentioned range listed, please consider follow up with your Primary Care Provider.   You have been scheduled for an endoscopy. Please follow written instructions given to you at your visit today. If you use inhalers (even only as needed), please bring them with you on the day of your procedure.  Your provider has requested that you go to the basement level for lab work before leaving today. Press "B" on the elevator. The lab is located at the first door on the left as you exit the elevator.  Due to recent changes in healthcare laws, you may see the results of your imaging and laboratory studies on MyChart before your provider has had a chance to review them.  We understand that in some cases there may be results that are confusing or concerning to you. Not all laboratory results come back in the same time frame and the provider may be waiting for multiple results in order to interpret others.  Please give [place] 48 hours in order for your provider to thoroughly review all the results before contacting the office for clarification of your results.   We have sent the following medications to your pharmacy for you to pick up at your convenience:  START: omeprazole 20mg  take one capsule daily.  Thank you for entrusting me with your care and choosing [facility].  Dr [facility]

## 2020-01-16 ENCOUNTER — Telehealth: Payer: Self-pay | Admitting: Internal Medicine

## 2020-01-16 NOTE — Telephone Encounter (Signed)
Wife stating the drug rep dropped off two boxes of ozempic for the patient and just wanted to verify if we have it.  Brendan [phone]

## 2020-01-17 ENCOUNTER — Ambulatory Visit: Payer: No Typology Code available for payment source | Admitting: Internal Medicine

## 2020-01-30 ENCOUNTER — Other Ambulatory Visit: Payer: Self-pay

## 2020-01-30 ENCOUNTER — Ambulatory Visit (INDEPENDENT_AMBULATORY_CARE_PROVIDER_SITE_OTHER): Payer: No Typology Code available for payment source | Admitting: Internal Medicine

## 2020-01-30 ENCOUNTER — Encounter: Payer: Self-pay | Admitting: Internal Medicine

## 2020-01-30 VITALS — BP 120/80 | HR 86 | Temp 98.2°F | Ht 71.0 in | Wt 240.0 lb

## 2020-01-30 DIAGNOSIS — I10 Essential (primary) hypertension: Secondary | ICD-10-CM

## 2020-01-30 DIAGNOSIS — E785 Hyperlipidemia, unspecified: Secondary | ICD-10-CM | POA: Diagnosis not present

## 2020-01-30 DIAGNOSIS — E1165 Type 2 diabetes mellitus with hyperglycemia: Secondary | ICD-10-CM | POA: Diagnosis not present

## 2020-01-30 MED ORDER — OZEMPIC (1 MG/DOSE) 2 MG/1.5ML ~~LOC~~ SOPN: 1.0000 mg | PEN_INJECTOR | SUBCUTANEOUS | 11 refills | Status: DC

## 2020-01-30 NOTE — Progress Notes (Signed)
Subjective:    Patient ID: Brendan Washington, male    DOB: [birth year], 58 y.o.   MRN: [id number]  HPI  Here to f/u; overall doing ok,  Pt denies chest pain, increasing sob or doe, wheezing, orthopnea, PND, increased LE swelling, palpitations, dizziness or syncope.  Pt denies new neurological symptoms such as new headache, or facial or extremity weakness or numbness.  Pt denies polydipsia, polyuria, or low sugar episode.  Pt states overall good compliance with meds, mostly trying to follow appropriate diet, with wt overall stable,  but little exercise however. Lost wt with ozempic and better diet - 15 lbs BP Readings from Last 3 Encounters:  01/30/20 120/80  01/13/20 118/78  10/05/19 136/72   Wt Readings from Last 3 Encounters:  01/30/20 240 lb (108.9 kg)  01/13/20 246 lb 12.8 oz (111.9 kg)  10/05/19 255 lb (115.7 kg)   Past Medical History:  Diagnosis Date  . Hypercholesteremia   . Hyperglycemia 07/31/2015  . Hyperlipidemia 07/31/2015   Past Surgical History:  Procedure Laterality Date  . BILATERAL CARPAL TUNNEL RELEASE    . CARPAL TUNNEL WITH CUBITAL TUNNEL      reports that he quit smoking about 34 years ago. His smoking use included cigarettes. He has never used smokeless tobacco. He reports current alcohol use. He reports that he does not use drugs. family history includes Healthy in his brother; Heart disease in his father; Kidney disease in his father; Parkinson's disease in his mother; Stroke in his father. Allergies  Allergen Reactions  . Penicillins   . Tetanus Toxoid     REACTION: arm swelling   Current Outpatient Medications on File Prior to Visit  Medication Sig Dispense Refill  . Ascorbic Acid (VITAMIN C) 1000 MG tablet Take 1,000 mg by mouth daily.    Marland Kitchen aspirin EC 81 MG tablet Take 1 tablet (81 mg total) by mouth daily. 90 tablet 11  . B Complex-C (SUPER B COMPLEX PO) Take 1 tablet by mouth daily.    . Chromium 1000 MCG TABS Take 1 tablet by mouth daily.    Marland Kitchen  CINNAMON PO Take 1,000 mg by mouth daily.    Marland Kitchen ELDERBERRY PO Take 1 tablet by mouth daily.    Marland Kitchen glucose blood (COOL BLOOD GLUCOSE TEST STRIPS) test strip Use as instructed to check blood sugar daily. DX: E11.9 100 each 12  . metFORMIN (GLUCOPHAGE-XR) 500 MG 24 hr tablet Take 3 tablets (1,500 mg total) by mouth daily with breakfast. (Patient taking differently: Take 500 mg by mouth 3 (three) times daily. ) 270 tablet 3  . NON FORMULARY Take 1 tablet by mouth daily. [facility]    . omeprazole (PRILOSEC) 20 MG capsule Take 1 capsule (20 mg total) by mouth daily. 30 capsule 11  . pyridoxine (B-6) 100 MG tablet Take 100 mg by mouth daily.    . rosuvastatin (CRESTOR) 20 MG tablet Take 1 tablet (20 mg total) by mouth daily. 90 tablet 3  . TURMERIC CURCUMIN PO Take 1,500 mg by mouth daily.    . vitamin B-12 (CYANOCOBALAMIN) 500 MCG tablet Take 500 mcg by mouth daily.     No current facility-administered medications on file prior to visit.   Review of Systems All otherwise neg per pt    Objective:   Physical Exam BP 120/80 (BP Location: Left Arm, Patient Position: Sitting, Cuff Size: Large)   Pulse 86   Temp 98.2 F (36.8 C) (Oral)   Ht 5\' 11"  (  1.803 m)   Wt 240 lb (108.9 kg)   SpO2 97%   BMI 33.47 kg/m  VS noted,  Constitutional: Pt appears in NAD HENT: Head: NCAT.  Right Ear: External ear normal.  Left Ear: External ear normal.  Eyes: . Pupils are equal, round, and reactive to light. Conjunctivae and EOM are normal Nose: without d/c or deformity Neck: Neck supple. Gross normal ROM Cardiovascular: Normal rate and regular rhythm.   Pulmonary/Chest: Effort normal and breath sounds without rales or wheezing.  Abd:  Soft, NT, ND, + BS, no organomegaly Neurological: Pt is alert. At baseline orientation, motor grossly intact Skin: Skin is warm. No rashes, other new lesions, no LE edema Psychiatric: Pt behavior is normal without agitation  All otherwise neg per pt Lab Results    Component Value Date   WBC 8.3 01/13/2020   HGB 14.4 01/13/2020   HCT 42.0 01/13/2020   PLT 249.0 01/13/2020   GLUCOSE 105 (H) 05/18/2019   CHOL 117 05/18/2019   TRIG 104.0 05/18/2019   HDL 41.30 05/18/2019   LDLCALC 55 05/18/2019   ALT 50 05/18/2019   AST 31 05/18/2019   NA 137 05/18/2019   K 3.6 05/18/2019   CL 102 05/18/2019   CREATININE 0.99 05/18/2019   BUN 12 05/18/2019   CO2 29 05/18/2019   TSH 1.58 05/18/2019   PSA 0.60 05/18/2019   HGBA1C 7.0 (H) 01/13/2020   MICROALBUR <0.7 05/18/2019      Assessment & Plan:

## 2020-01-30 NOTE — Patient Instructions (Addendum)
Your recent A1c was improved again  You are given the ozempic samples today for the 1 mg dosing weekly  I hope you do have the scope in the stomach soon  Please continue all other medications as before, and refills have been done if requested.  Please have the pharmacy call with any other refills you may need.  Please continue your efforts at being more active, low cholesterol diet, and weight control.  Please keep your appointments with your specialists as you may have planned  Please make an Appointment to return in 6 months, or sooner if needed

## 2020-02-05 ENCOUNTER — Encounter: Payer: Self-pay | Admitting: Internal Medicine

## 2020-02-05 NOTE — Assessment & Plan Note (Signed)
BP Readings from Last 3 Encounters:  01/30/20 120/80  01/13/20 118/78  10/05/19 136/72  stable overall by history and exam, recent data reviewed with pt, and pt to continue medical treatment as before,  to f/u any worsening symptoms or concerns

## 2020-02-05 NOTE — Assessment & Plan Note (Signed)
Lab Results  Component Value Date   LDLCALC 55 05/18/2019   stable overall by history and exam, recent data reviewed with pt, and pt to continue medical treatment as before,  to f/u any worsening symptoms or concerns

## 2020-02-05 NOTE — Assessment & Plan Note (Addendum)
Uncontrolled, for increased ozempic 1 mg  I spent 31 minutes in preparing to see the patient by review of recent labs, imaging and procedures, obtaining and reviewing separately obtained history, communicating with the patient and family or caregiver, ordering medications, tests or procedures, and documenting clinical information in the EHR including the differential Dx, treatment, and any further evaluation and other management of dm, htn, hld

## 2020-02-08 ENCOUNTER — Telehealth: Payer: Self-pay | Admitting: Gastroenterology

## 2020-02-08 NOTE — Telephone Encounter (Signed)
Spoke with pt regarding covid test. Pt refused to get a covid test done. Pt was explained that if he was not vaccinated that he would have to have one done. He decided to cancel his appt scheduled for Friday 02-10-20 @ 2:30 pm .   He will speak with his wife regarding this issue and in  the future if he wants to get a covid test he will call to  rescheduled.

## 2020-02-10 ENCOUNTER — Encounter: Payer: No Typology Code available for payment source | Admitting: Gastroenterology

## 2020-03-20 ENCOUNTER — Other Ambulatory Visit: Payer: Self-pay

## 2020-03-20 ENCOUNTER — Telehealth: Payer: Self-pay | Admitting: Internal Medicine

## 2020-03-20 MED ORDER — OZEMPIC (1 MG/DOSE) 2 MG/1.5ML ~~LOC~~ SOPN
1.0000 mg | PEN_INJECTOR | SUBCUTANEOUS | 11 refills | Status: DC
Start: 2020-03-20 — End: 2020-04-03

## 2020-03-20 NOTE — Telephone Encounter (Signed)
Patients wife called and was wondering if Semaglutide, 1 MG/DOSE, (OZEMPIC, 1 MG/DOSE,) 2 MG/1.5ML SOPN  Could be sent to [facility] [address] [place] - [address]. She said that they have better insurance and that [facility] only have the 0.5 mg on file. She was also wondering if it could be sent in as a 90 day supply.

## 2020-04-02 NOTE — Telephone Encounter (Signed)
    Spouse calling to request Semaglutide, 1 MG/DOSE, (OZEMPIC, 1 MG/DOSE,) 2 MG/1.5ML SOPN be sent to pharmacy on file

## 2020-04-03 ENCOUNTER — Other Ambulatory Visit: Payer: Self-pay

## 2020-04-03 MED ORDER — OZEMPIC (1 MG/DOSE) 2 MG/1.5ML ~~LOC~~ SOPN: 1.0000 mg | PEN_INJECTOR | SUBCUTANEOUS | 11 refills | Status: DC

## 2020-05-14 ENCOUNTER — Telehealth: Payer: Self-pay | Admitting: Internal Medicine

## 2020-05-14 ENCOUNTER — Other Ambulatory Visit: Payer: Self-pay | Admitting: Internal Medicine

## 2020-05-14 MED ORDER — OZEMPIC (1 MG/DOSE) 2 MG/1.5ML ~~LOC~~ SOPN
1.0000 mg | PEN_INJECTOR | SUBCUTANEOUS | 11 refills | Status: DC
Start: 2020-05-14 — End: 2021-01-30

## 2020-05-14 NOTE — Telephone Encounter (Signed)
Semaglutide, 1 MG/DOSE, (OZEMPIC, 1 MG/DOSE,) 2 MG/1.5ML [facility] [address] [place], [place] - [address] Phone:  [phone]  Fax:  [phone]     Patients wife calling, states when we sent the medication in last time it was sent in as .5mg  and not the 1 mg and he is now out of medication because he has been taking the 1 mg dosage. They also need it sent to the new pharmacy listed above.

## 2020-05-14 NOTE — Telephone Encounter (Signed)
Done erx 

## 2020-05-14 NOTE — Telephone Encounter (Signed)
Follow up message  Patient/ spouse calling for status of refill. Requesting call from [facility]

## 2020-05-14 NOTE — Telephone Encounter (Signed)
Ok to call pt - we reserve the right to take up to 3 days for refills and will try to get to this asap

## 2020-05-15 NOTE — Telephone Encounter (Signed)
Notified patient Wife via voicemail that  we reserve the right to take up to 3 days for refills and will try to get to this asap

## 2020-06-07 ENCOUNTER — Other Ambulatory Visit: Payer: Self-pay | Admitting: Internal Medicine

## 2020-06-07 NOTE — Telephone Encounter (Signed)
Please refill as per office routine med refill policy (all routine meds refilled for 3 mo or monthly per pt preference up to one year from last visit, then month to month grace period for 3 mo, then further med refills will have to be denied)  

## 2020-06-25 ENCOUNTER — Encounter: Payer: Self-pay | Admitting: Internal Medicine

## 2020-06-26 MED ORDER — ROSUVASTATIN CALCIUM 20 MG PO TABS
20.0000 mg | ORAL_TABLET | Freq: Every day | ORAL | 0 refills | Status: DC
Start: 2020-06-26 — End: 2020-07-24

## 2020-07-23 ENCOUNTER — Other Ambulatory Visit: Payer: Self-pay | Admitting: Internal Medicine

## 2020-07-24 NOTE — Telephone Encounter (Signed)
Please refill as per office routine med refill policy (all routine meds refilled for 3 mo or monthly per pt preference up to one year from last visit, then month to month grace period for 3 mo, then further med refills will have to be denied)  

## 2020-07-31 ENCOUNTER — Other Ambulatory Visit (INDEPENDENT_AMBULATORY_CARE_PROVIDER_SITE_OTHER): Payer: 59

## 2020-07-31 ENCOUNTER — Ambulatory Visit (INDEPENDENT_AMBULATORY_CARE_PROVIDER_SITE_OTHER): Payer: 59 | Admitting: Internal Medicine

## 2020-07-31 ENCOUNTER — Encounter: Payer: Self-pay | Admitting: Internal Medicine

## 2020-07-31 ENCOUNTER — Other Ambulatory Visit: Payer: Self-pay

## 2020-07-31 VITALS — BP 130/74 | HR 100 | Temp 99.2°F | Ht 71.0 in | Wt 241.0 lb

## 2020-07-31 DIAGNOSIS — E559 Vitamin D deficiency, unspecified: Secondary | ICD-10-CM

## 2020-07-31 DIAGNOSIS — E538 Deficiency of other specified B group vitamins: Secondary | ICD-10-CM

## 2020-07-31 DIAGNOSIS — Z0001 Encounter for general adult medical examination with abnormal findings: Secondary | ICD-10-CM

## 2020-07-31 DIAGNOSIS — E7800 Pure hypercholesterolemia, unspecified: Secondary | ICD-10-CM | POA: Diagnosis not present

## 2020-07-31 DIAGNOSIS — E1165 Type 2 diabetes mellitus with hyperglycemia: Secondary | ICD-10-CM | POA: Diagnosis not present

## 2020-07-31 DIAGNOSIS — Z0000 Encounter for general adult medical examination without abnormal findings: Secondary | ICD-10-CM

## 2020-07-31 DIAGNOSIS — I10 Essential (primary) hypertension: Secondary | ICD-10-CM

## 2020-07-31 DIAGNOSIS — Z125 Encounter for screening for malignant neoplasm of prostate: Secondary | ICD-10-CM

## 2020-07-31 LAB — HEPATIC FUNCTION PANEL
ALT: 28 U/L (ref 0–53)
AST: 19 U/L (ref 0–37)
Albumin: 4.6 g/dL (ref 3.5–5.2)
Alkaline Phosphatase: 69 U/L (ref 39–117)
Bilirubin, Direct: 0.1 mg/dL (ref 0.0–0.3)
Total Bilirubin: 0.6 mg/dL (ref 0.2–1.2)
Total Protein: 7.7 g/dL (ref 6.0–8.3)

## 2020-07-31 LAB — BASIC METABOLIC PANEL
BUN: 13 mg/dL (ref 6–23)
CO2: 27 mEq/L (ref 19–32)
Calcium: 9.9 mg/dL (ref 8.4–10.5)
Chloride: 102 mEq/L (ref 96–112)
Creatinine, Ser: 1.05 mg/dL (ref 0.40–1.50)
GFR: 78.45 mL/min (ref >60.00)
Glucose, Bld: 106 mg/dL — ABNORMAL HIGH (ref 70–99)
Potassium: 3.9 mEq/L (ref 3.5–5.1)
Sodium: 139 mEq/L (ref 135–145)

## 2020-07-31 LAB — MICROALBUMIN / CREATININE URINE RATIO
Creatinine,U: 181.5 mg/dL
Microalb Creat Ratio: 0.7 mg/g (ref 0.0–30.0)
Microalb, Ur: 1.3 mg/dL (ref 0.0–1.9)

## 2020-07-31 LAB — LIPID PANEL
Cholesterol: 109 mg/dL (ref 0–200)
HDL: 40.2 mg/dL (ref >39.00)
NonHDL: 68.39
Total CHOL/HDL Ratio: 3
Triglycerides: 223 mg/dL — ABNORMAL HIGH (ref 0.0–149.0)
VLDL: 44.6 mg/dL — ABNORMAL HIGH (ref 0.0–40.0)

## 2020-07-31 LAB — HEMOGLOBIN A1C: Hgb A1c MFr Bld: 6.2 % (ref 4.6–6.5)

## 2020-07-31 NOTE — Assessment & Plan Note (Signed)
Lab Results  Component Value Date   LDLCALC 55 05/18/2019   Stable, pt to continue current statin crestor 20

## 2020-07-31 NOTE — Assessment & Plan Note (Signed)
Improved but  Last vitamin D Lab Results  Component Value Date   VD25OH 30.03 05/18/2019   Still Low normal, to start oral replacement

## 2020-07-31 NOTE — Assessment & Plan Note (Signed)
BP Readings from Last 3 Encounters:  07/31/20 130/74  01/30/20 120/80  01/13/20 118/78   Stable, pt to continue medical treatment  - low salt diet

## 2020-07-31 NOTE — Progress Notes (Signed)
Patient ID: Brendan Washington, male   DOB: Mar 30, 1960, 60 y.o.   MRN: [id number]         Chief Complaint:: wellness exam and Follow-up  Dm, HTN, HLD       HPI:  Brendan Washington is a 60 y.o. male here for wellness exam; due for cologuard, but declines all vaccinations; o/w up to date with proventive referrals.                         Also had oct 2021 illness GI related, and deferred the recommended EGD and colonoscopy.  Due for cologuard.  Has lost some wt per pt on the ozempic, just seems to have less appetite and no GI upset or other apparent side effect  Pt denies chest pain, increased sob or doe, wheezing, orthopnea, PND, increased LE swelling, palpitations, dizziness or syncope.   Pt denies polydipsia, polyuria, or new focal neuro s/s.  No new complaint Wt Readings from Last 3 Encounters:  07/31/20 241 lb (109.3 kg)  01/30/20 240 lb (108.9 kg)  01/13/20 246 lb 12.8 oz (111.9 kg)   BP Readings from Last 3 Encounters:  07/31/20 130/74  01/30/20 120/80  01/13/20 118/78   Immunization History  Administered Date(s) Administered  . Influenza,inj,Quad PF,6+ Mos 12/25/2017   Health Maintenance Due  Topic Date Due  . Fecal DNA (Cologuard)  10/03/2018  . URINE MICROALBUMIN  05/17/2020  . HEMOGLOBIN A1C  07/12/2020      Past Medical History:  Diagnosis Date  . Hypercholesteremia   . Hyperglycemia 07/31/2015  . Hyperlipidemia 07/31/2015   Past Surgical History:  Procedure Laterality Date  . BILATERAL CARPAL TUNNEL RELEASE    . CARPAL TUNNEL WITH CUBITAL TUNNEL      reports that he quit smoking about 35 years ago. His smoking use included cigarettes. He has never used smokeless tobacco. He reports current alcohol use. He reports that he does not use drugs. family history includes Healthy in his brother; Heart disease in his father; Kidney disease in his father; Parkinson's disease in his mother; Stroke in his father. Allergies  Allergen Reactions  . Penicillins   . Tetanus Toxoid      REACTION: arm swelling   Current Outpatient Medications on File Prior to Visit  Medication Sig Dispense Refill  . Ascorbic Acid (VITAMIN C) 1000 MG tablet Take 1,000 mg by mouth daily.    Marland Kitchen aspirin EC 81 MG tablet Take 1 tablet (81 mg total) by mouth daily. 90 tablet 11  . B Complex-C (SUPER B COMPLEX PO) Take 1 tablet by mouth daily.    . Chromium 1000 MCG TABS Take 1 tablet by mouth daily.    Marland Kitchen CINNAMON PO Take 1,000 mg by mouth daily.    Marland Kitchen ELDERBERRY PO Take 1 tablet by mouth daily.    Marland Kitchen glucose blood (COOL BLOOD GLUCOSE TEST STRIPS) test strip Use as instructed to check blood sugar daily. DX: E11.9 100 each 12  . metFORMIN (GLUCOPHAGE-XR) 500 MG 24 hr tablet TAKE 3 TABLETS BY MOUTH ONCE DAILY WITH BREAKFAST 90 tablet 1  . NON FORMULARY Take 1 tablet by mouth daily. [facility]    . omeprazole (PRILOSEC) 20 MG capsule Take 1 capsule (20 mg total) by mouth daily. 30 capsule 11  . pyridoxine (B-6) 100 MG tablet Take 100 mg by mouth daily.    . rosuvastatin (CRESTOR) 20 MG tablet TAKE 1 TABLET BY MOUTH ONCE DAILY . APPOINTMENT  REQUIRED FOR FUTURE REFILLS 30 tablet 0  . Semaglutide, 1 MG/DOSE, (OZEMPIC, 1 MG/DOSE,) 2 MG/1.5ML SOPN Inject 1 mg into the skin once a week. 1.5 mL 11  . TURMERIC CURCUMIN PO Take 1,500 mg by mouth daily.    . vitamin B-12 (CYANOCOBALAMIN) 500 MCG tablet Take 500 mcg by mouth daily.     No current facility-administered medications on file prior to visit.        ROS:  All others reviewed and negative.  Objective        PE:  BP 130/74 (BP Location: Right Arm, Patient Position: Sitting, Cuff Size: Large)   Pulse 100   Temp 99.2 F (37.3 C) (Oral)   Ht 5\' 11"  (1.803 m)   Wt 241 lb (109.3 kg)   SpO2 95%   BMI 33.61 kg/m                 Constitutional: Pt appears in NAD               HENT: Head: NCAT.                Right Ear: External ear normal.                 Left Ear: External ear normal.                Eyes: . Pupils are equal, round, and  reactive to light. Conjunctivae and EOM are normal               Nose: without d/c or deformity               Neck: Neck supple. Gross normal ROM               Cardiovascular: Normal rate and regular rhythm.                 Pulmonary/Chest: Effort normal and breath sounds without rales or wheezing.                Abd:  Soft, NT, ND, + BS, no organomegaly               Neurological: Pt is alert. At baseline orientation, motor grossly intact               Skin: Skin is warm. No rashes, no other new lesions, LE edema - none               Psychiatric: Pt behavior is normal without agitation   Micro: none  Cardiac tracings I have personally interpreted today:  none  Pertinent Radiological findings (summarize): none   Lab Results  Component Value Date   WBC 8.3 01/13/2020   HGB 14.4 01/13/2020   HCT 42.0 01/13/2020   PLT 249.0 01/13/2020   GLUCOSE 105 (H) 05/18/2019   CHOL 117 05/18/2019   TRIG 104.0 05/18/2019   HDL 41.30 05/18/2019   LDLCALC 55 05/18/2019   ALT 50 05/18/2019   AST 31 05/18/2019   NA 137 05/18/2019   K 3.6 05/18/2019   CL 102 05/18/2019   CREATININE 0.99 05/18/2019   BUN 12 05/18/2019   CO2 29 05/18/2019   TSH 1.58 05/18/2019   PSA 0.60 05/18/2019   HGBA1C 7.0 (H) 01/13/2020   MICROALBUR <0.7 05/18/2019   Assessment/Plan:  Brendan Washington is a 60 y.o. White or Caucasian [1] male with  has a past medical history of Hypercholesteremia, Hyperglycemia (07/31/2015), and Hyperlipidemia (07/31/2015).  Encounter for well adult exam with abnormal findings Age and sex appropriate education and counseling updated with regular exercise and diet Referrals for preventative services - for cologuard Immunizations addressed - declines all vax today Smoking counseling  - none needed Evidence for depression or other mood disorder - none significant Most recent labs reviewed. I have personally reviewed and have noted: 1) the patient's medical and social history 2) The  patient's current medications and supplements 3) The patient's height, weight, and BMI have been recorded in the chart   Essential hypertension BP Readings from Last 3 Encounters:  07/31/20 130/74  01/30/20 120/80  01/13/20 118/78   Stable, pt to continue medical treatment  - low salt diet   Diabetes (HCC) Lab Results  Component Value Date   HGBA1C 7.0 (H) 01/13/2020   Mild uncontrolled but o/w Stable, declines med change, pt to continue current medical treatment  - metformin   Hyperlipidemia Lab Results  Component Value Date   LDLCALC 55 05/18/2019   Stable, pt to continue current statin crestor 20   Vitamin D deficiency Improved but  Last vitamin D Lab Results  Component Value Date   VD25OH 30.03 05/18/2019   Still Low normal, to start oral replacement   Followup: Return in about 6 months (around 01/30/2021).  [name], MD 07/31/2020 7:30 PM [facility]

## 2020-07-31 NOTE — Assessment & Plan Note (Signed)
Age and sex appropriate education and counseling updated with regular exercise and diet Referrals for preventative services - for cologuard Immunizations addressed - declines all vax today Smoking counseling  - none needed Evidence for depression or other mood disorder - none significant Most recent labs reviewed. I have personally reviewed and have noted: 1) the patient's medical and social history 2) The patient's current medications and supplements 3) The patient's height, weight, and BMI have been recorded in the chart

## 2020-07-31 NOTE — Patient Instructions (Addendum)
We will sign you up for the cologuard testing  Please continue all other medications as before, and refills have been done if requested.  Please have the pharmacy call with any other refills you may need.  Please continue your efforts at being more active, low cholesterol diet, and weight control.  You are otherwise up to date with prevention measures today.  Please keep your appointments with your specialists as you may have planned  Please go to the LAB at the blood drawing area for the tests to be done - at the [facility] site today  You will be contacted by phone if any changes need to be made immediately.  Otherwise, you will receive a letter about your results with an explanation, but please check with MyChart first.  Please remember to sign up for MyChart if you have not done so, as this will be important to you in the future with finding out test results, communicating by private email, and scheduling acute appointments online when needed.  Please make an Appointment to return in 6 months, or sooner if needed

## 2020-07-31 NOTE — Assessment & Plan Note (Addendum)
Lab Results  Component Value Date   HGBA1C 7.0 (H) 01/13/2020   Mild uncontrolled but o/w Stable, declines med change, pt to continue current medical treatment  - metformin

## 2020-08-01 LAB — URINALYSIS, ROUTINE W REFLEX MICROSCOPIC
Bilirubin Urine: NEGATIVE
Hgb urine dipstick: NEGATIVE
Ketones, ur: NEGATIVE
Leukocytes,Ua: NEGATIVE
Nitrite: NEGATIVE
RBC / HPF: NONE SEEN (ref 0–?)
Specific Gravity, Urine: >=1.03 — AB (ref 1.000–1.030)
Total Protein, Urine: NEGATIVE
Urine Glucose: NEGATIVE
Urobilinogen, UA: 0.2 (ref 0.0–1.0)
pH: 5.5 (ref 5.0–8.0)

## 2020-08-01 LAB — CBC WITH DIFFERENTIAL/PLATELET
Basophils Absolute: 0.1 10*3/uL (ref 0.0–0.1)
Basophils Relative: 0.6 % (ref 0.0–3.0)
Eosinophils Absolute: 0.8 10*3/uL — ABNORMAL HIGH (ref 0.0–0.7)
Eosinophils Relative: 6.4 % — ABNORMAL HIGH (ref 0.0–5.0)
HCT: 46 % (ref 39.0–52.0)
Hemoglobin: 15.8 g/dL (ref 13.0–17.0)
Lymphocytes Relative: 24.7 % (ref 12.0–46.0)
Lymphs Abs: 3.2 10*3/uL (ref 0.7–4.0)
MCHC: 34.5 g/dL (ref 30.0–36.0)
MCV: 91.8 fl (ref 78.0–100.0)
Monocytes Absolute: 0.9 10*3/uL (ref 0.1–1.0)
Monocytes Relative: 6.8 % (ref 3.0–12.0)
Neutro Abs: 7.8 10*3/uL — ABNORMAL HIGH (ref 1.4–7.7)
Neutrophils Relative %: 61.5 % (ref 43.0–77.0)
Platelets: 263 10*3/uL (ref 150.0–400.0)
RBC: 5.01 Mil/uL (ref 4.22–5.81)
RDW: 13.1 % (ref 11.5–15.5)
WBC: 12.8 10*3/uL — ABNORMAL HIGH (ref 4.0–10.5)

## 2020-08-01 LAB — PSA: PSA: 0.62 ng/mL (ref 0.10–4.00)

## 2020-08-01 LAB — TSH: TSH: 2.09 u[IU]/mL (ref 0.35–4.50)

## 2020-08-01 LAB — LDL CHOLESTEROL, DIRECT: Direct LDL: 46 mg/dL

## 2020-08-01 LAB — VITAMIN D 25 HYDROXY (VIT D DEFICIENCY, FRACTURES): VITD: 27.44 ng/mL — ABNORMAL LOW (ref 30.00–100.00)

## 2020-08-01 LAB — VITAMIN B12: Vitamin B-12: >1550 pg/mL — ABNORMAL HIGH (ref 211–911)

## 2020-08-02 ENCOUNTER — Encounter: Payer: Self-pay | Admitting: Internal Medicine

## 2020-08-06 ENCOUNTER — Other Ambulatory Visit: Payer: Self-pay

## 2020-08-06 DIAGNOSIS — Z1211 Encounter for screening for malignant neoplasm of colon: Secondary | ICD-10-CM

## 2020-08-06 DIAGNOSIS — Z1212 Encounter for screening for malignant neoplasm of rectum: Secondary | ICD-10-CM

## 2020-08-27 ENCOUNTER — Other Ambulatory Visit: Payer: Self-pay | Admitting: Internal Medicine

## 2020-08-30 ENCOUNTER — Other Ambulatory Visit: Payer: Self-pay | Admitting: Internal Medicine

## 2020-08-30 NOTE — Telephone Encounter (Signed)
Please refill as per office routine med refill policy (all routine meds refilled for 3 mo or monthly per pt preference up to one year from last visit, then month to month grace period for 3 mo, then further med refills will have to be denied)  

## 2020-09-05 ENCOUNTER — Telehealth: Payer: Self-pay | Admitting: Internal Medicine

## 2020-09-05 NOTE — Telephone Encounter (Signed)
    Patient calling to request Tamsulosin be prescribed to help with kidney stone. Patient states he went to ED over the weekend but has still not passed stone.   Please call

## 2020-09-07 MED ORDER — TAMSULOSIN HCL 0.4 MG PO CAPS: 0.4000 mg | ORAL_CAPSULE | Freq: Every day | ORAL | 0 refills | Status: DC

## 2020-09-07 NOTE — Telephone Encounter (Signed)
Ok done to [facility] 

## 2020-09-17 LAB — COLOGUARD: COLOGUARD: NEGATIVE

## 2021-01-30 ENCOUNTER — Encounter: Payer: Self-pay | Admitting: Internal Medicine

## 2021-01-30 ENCOUNTER — Other Ambulatory Visit: Payer: Self-pay

## 2021-01-30 ENCOUNTER — Ambulatory Visit (INDEPENDENT_AMBULATORY_CARE_PROVIDER_SITE_OTHER): Payer: 59 | Admitting: Internal Medicine

## 2021-01-30 VITALS — BP 130/70 | HR 85 | Resp 18 | Ht 71.0 in | Wt 244.2 lb

## 2021-01-30 DIAGNOSIS — E559 Vitamin D deficiency, unspecified: Secondary | ICD-10-CM | POA: Diagnosis not present

## 2021-01-30 DIAGNOSIS — E7800 Pure hypercholesterolemia, unspecified: Secondary | ICD-10-CM

## 2021-01-30 DIAGNOSIS — E1165 Type 2 diabetes mellitus with hyperglycemia: Secondary | ICD-10-CM | POA: Diagnosis not present

## 2021-01-30 DIAGNOSIS — I10 Essential (primary) hypertension: Secondary | ICD-10-CM | POA: Diagnosis not present

## 2021-01-30 DIAGNOSIS — Z0000 Encounter for general adult medical examination without abnormal findings: Secondary | ICD-10-CM

## 2021-01-30 DIAGNOSIS — E538 Deficiency of other specified B group vitamins: Secondary | ICD-10-CM

## 2021-01-30 LAB — POCT GLYCOSYLATED HEMOGLOBIN (HGB A1C): Hemoglobin A1C: 6 % — AB (ref 4.0–5.6)

## 2021-01-30 MED ORDER — OZEMPIC (0.25 OR 0.5 MG/DOSE) 2 MG/1.5ML ~~LOC~~ SOPN: 2.0000 mg | PEN_INJECTOR | SUBCUTANEOUS | 3 refills | Status: DC

## 2021-01-30 MED ORDER — METFORMIN HCL ER 500 MG PO TB24: ORAL_TABLET | ORAL | 3 refills | Status: DC

## 2021-01-30 NOTE — Patient Instructions (Addendum)
Your A1c was done today  Ok to decrease the metformin to 1 pill in the AM  Ok to increase the Ozempic to 2 mg weekly  Ok to cut back on the B12 supplements to Mon-wed-friday  Please continue all other medications as before, and refills have been done if requested.  Please have the pharmacy call with any other refills you may need.  Please continue your efforts at being more active, low cholesterol diet, and weight control.  Please keep your appointments with your specialists as you may have planned  Please make an Appointment to return in 6 months, or sooner if needed, also with Lab Appointment for testing done 3-5 days before at the FIRST FLOOR Lab (so this is for TWO appointments - please see the scheduling desk as you leave)  Due to the ongoing Covid 19 pandemic, our lab now requires an appointment for any labs done at our office.  If you need labs done and do not have an appointment, please call our office ahead of time to schedule before presenting to the lab for your testing.

## 2021-01-30 NOTE — Addendum Note (Signed)
Addended by: Corwin Levins on: 01/30/2021 09:40 PM   Modules accepted: Orders

## 2021-01-30 NOTE — Assessment & Plan Note (Signed)
Lab Results  Component Value Date   LDLCALC 55 05/18/2019   Stable, pt to continue current statin crestor 20

## 2021-01-30 NOTE — Progress Notes (Signed)
Patient ID: Brendan Washington, male   DOB: 1960/04/12, 60 y.o.   MRN: [id number]        Chief Complaint: follow up DM, low vit d and b12       HPI:  Brendan Washington is a 60 y.o. male here overall doing ok, Pt denies chest pain, increased sob or doe, wheezing, orthopnea, PND, increased LE swelling, palpitations, dizziness or syncope.   Pt denies polydipsia, polyuria, or new focal neuro s/s.  Admits to taking 2 metformin today with better diet and excellent a1c in My 2022, and has been doing about the same since then with cbg's in the low 100s.  Wt up only a few lbs. Hard to lose wt, asks for increased ozempic.   Pt denies fever, wt loss, night sweats, loss of appetite, or other constitutional symptoms  Passed a renal stone June 2022 .Denies urinary symptoms such as dysuria, frequency, urgency, flank pain, hematuria or n/v, fever, chills.   Wt Readings from Last 3 Encounters:  01/30/21 244 lb 3.2 oz (110.8 kg)  07/31/20 241 lb (109.3 kg)  01/30/20 240 lb (108.9 kg)   BP Readings from Last 3 Encounters:  01/30/21 130/70  07/31/20 130/74  01/30/20 120/80         Past Medical History:  Diagnosis Date   Hypercholesteremia    Hyperglycemia 07/31/2015   Hyperlipidemia 07/31/2015   Past Surgical History:  Procedure Laterality Date   BILATERAL CARPAL TUNNEL RELEASE     CARPAL TUNNEL WITH CUBITAL TUNNEL      reports that he quit smoking about 35 years ago. His smoking use included cigarettes. He has never used smokeless tobacco. He reports current alcohol use. He reports that he does not use drugs. family history includes Healthy in his brother; Heart disease in his father; Kidney disease in his father; Parkinson's disease in his mother; Stroke in his father. Allergies  Allergen Reactions   Penicillins    Tetanus Toxoid     REACTION: arm swelling   Current Outpatient Medications on File Prior to Visit  Medication Sig Dispense Refill   Ascorbic Acid (VITAMIN C) 1000 MG tablet Take 1,000 mg by  mouth daily.     aspirin EC 81 MG tablet Take 1 tablet (81 mg total) by mouth daily. 90 tablet 11   azithromycin (ZITHROMAX) 250 MG tablet Take 250 mg by mouth daily.     B Complex-C (SUPER B COMPLEX PO) Take 1 tablet by mouth daily.     benzonatate (TESSALON) 200 MG capsule Take 200 mg by mouth 3 (three) times daily.     Chromium 1000 MCG TABS Take 1 tablet by mouth daily.     CINNAMON PO Take 1,000 mg by mouth daily.     ELDERBERRY PO Take 1 tablet by mouth daily.     glucose blood (COOL BLOOD GLUCOSE TEST STRIPS) test strip Use as instructed to check blood sugar daily. DX: E11.9 100 each 12   NON FORMULARY Take 1 tablet by mouth daily. [facility]     omeprazole (PRILOSEC) 20 MG capsule Take 1 capsule (20 mg total) by mouth daily. 30 capsule 11   pyridoxine (B-6) 100 MG tablet Take 100 mg by mouth daily.     rosuvastatin (CRESTOR) 20 MG tablet TAKE 1 TABLET BY MOUTH ONCE DAILY 90 tablet 2   TURMERIC CURCUMIN PO Take 1,500 mg by mouth daily.     vitamin B-12 (CYANOCOBALAMIN) 500 MCG tablet Take 500 mcg by mouth daily.  No current facility-administered medications on file prior to visit.        ROS:  All others reviewed and negative.  Objective        PE:  BP 130/70   Pulse 85   Resp 18   Ht 5\' 11"  (1.803 m)   Wt 244 lb 3.2 oz (110.8 kg)   SpO2 97%   BMI 34.06 kg/m                 Constitutional: Pt appears in NAD               HENT: Head: NCAT.                Right Ear: External ear normal.                 Left Ear: External ear normal.                Eyes: . Pupils are equal, round, and reactive to light. Conjunctivae and EOM are normal               Nose: without d/c or deformity               Neck: Neck supple. Gross normal ROM               Cardiovascular: Normal rate and regular rhythm.                 Pulmonary/Chest: Effort normal and breath sounds without rales or wheezing.                Abd:  Soft, NT, ND, + BS, no organomegaly                Neurological: Pt is alert. At baseline orientation, motor grossly intact               Skin: Skin is warm. No rashes, no other new lesions, LE edema -  done               Psychiatric: Pt behavior is normal without agitation   Micro: none  Cardiac tracings I have personally interpreted today:  none  Pertinent Radiological findings (summarize): none   Lab Results  Component Value Date   WBC 12.8 (H) 07/31/2020   HGB 15.8 07/31/2020   HCT 46.0 07/31/2020   PLT 263.0 07/31/2020   GLUCOSE 106 (H) 07/31/2020   CHOL 109 07/31/2020   TRIG 223.0 (H) 07/31/2020   HDL 40.20 07/31/2020   LDLDIRECT 46.0 07/31/2020   LDLCALC 55 05/18/2019   ALT 28 07/31/2020   AST 19 07/31/2020   NA 139 07/31/2020   K 3.9 07/31/2020   CL 102 07/31/2020   CREATININE 1.05 07/31/2020   BUN 13 07/31/2020   CO2 27 07/31/2020   TSH 2.09 07/31/2020   PSA 0.62 07/31/2020   HGBA1C 6.0 (A) 01/30/2021   MICROALBUR 1.3 07/31/2020   Hemoglobin A1C 4.0 - 5.6 % 6.0 Abnormal   6.2 R, CM  7.0 High  R, CM  7.4 High  R, CM  8.1 High  R, CM  7.8 High      Assessment/Plan:  Brendan Washington is a 60 y.o. White or Caucasian [1] male with  has a past medical history of Hypercholesteremia, Hyperglycemia (07/31/2015), and Hyperlipidemia (07/31/2015).  Vitamin D deficiency . Last vitamin D Lab Results  Component Value Date   VD25OH 27.44 (L) 07/31/2020   Low, to start oral replacement  Essential hypertension BP Readings from Last 3 Encounters:  01/30/21 130/70  07/31/20 130/74  01/30/20 120/80   Stable, pt to continue medical treatment  - diet, wt control, low salt, excercise   Diabetes (HCC) Lab Results  Component Value Date   HGBA1C 6.0 (A) 01/30/2021   Stable, pt to continue current medical treatment except to help lose wt will decrease the metformin ER 500 qd and increased ozempic to 2 mg weekly   Hyperlipidemia Lab Results  Component Value Date   LDLCALC 55 05/18/2019   Stable, pt to continue  current statin crestor 20  Followup: Return in about 6 months (around 07/30/2021).  Brendan Barre, MD 01/30/2021 9:37 PM [facility]

## 2021-01-30 NOTE — Assessment & Plan Note (Signed)
BP Readings from Last 3 Encounters:  01/30/21 130/70  07/31/20 130/74  01/30/20 120/80   Stable, pt to continue medical treatment  - diet, wt control, low salt, excercise

## 2021-01-30 NOTE — Assessment & Plan Note (Signed)
Lab Results  Component Value Date   HGBA1C 6.0 (A) 01/30/2021   Stable, pt to continue current medical treatment except to help lose wt will decrease the metformin ER 500 qd and increased ozempic to 2 mg weekly

## 2021-01-30 NOTE — Assessment & Plan Note (Signed)
.   Last vitamin D Lab Results  Component Value Date   VD25OH 27.44 (L) 07/31/2020   Low, to start oral replacement

## 2021-02-13 ENCOUNTER — Telehealth: Payer: Self-pay | Admitting: Internal Medicine

## 2021-02-13 NOTE — Telephone Encounter (Signed)
Duplicate message closing encounter

## 2021-02-13 NOTE — Telephone Encounter (Signed)
Spoke with patient to confirm right pharmacy. Prescription sent 01/30/21. Pharmacy states that rx was cancelled out their system. Verbal given to pharmacist.

## 2021-02-13 NOTE — Telephone Encounter (Signed)
Patient's spouse Marylene Land states rx is out of stock at [facility] requesting a call back to discuss alternative medication

## 2021-02-13 NOTE — Telephone Encounter (Signed)
Patient's spouse states pharmacy has not received rx for Semaglutide,0.25 or 0.5MG /DOS, (OZEMPIC, 0.25 OR 0.5 MG/DOSE,) 2 MG/1.5ML SOPN  Informed caller rx was sent on 01-30-2021  Caller requesting new rx sent to pharmacy

## 2021-04-17 ENCOUNTER — Telehealth: Payer: Self-pay

## 2021-04-17 NOTE — Telephone Encounter (Signed)
Lauren is calling with the key to the PA for Semaglutide,0.25 or 0.5MG /DOS, (OZEMPIC, 0.25 OR 0.5 MG/DOSE,) 2 MG/1.5ML SOPN  Pt missed Sunday dose  Key Code  [id number]  Please update via Mychart per Daughter or call at [phone] can leave detail message.   Pt would like samples if there is a delay in the approval of PA

## 2021-04-17 NOTE — Telephone Encounter (Signed)
PA for Ozempic sent to plan

## 2021-04-19 NOTE — Telephone Encounter (Signed)
Patient's daughter notified that PA was approved

## 2021-06-02 ENCOUNTER — Other Ambulatory Visit: Payer: Self-pay | Admitting: Internal Medicine

## 2021-08-02 ENCOUNTER — Ambulatory Visit: Payer: 59 | Admitting: Internal Medicine

## 2021-08-05 ENCOUNTER — Other Ambulatory Visit (INDEPENDENT_AMBULATORY_CARE_PROVIDER_SITE_OTHER): Payer: Self-pay

## 2021-08-05 DIAGNOSIS — E538 Deficiency of other specified B group vitamins: Secondary | ICD-10-CM

## 2021-08-05 DIAGNOSIS — E1165 Type 2 diabetes mellitus with hyperglycemia: Secondary | ICD-10-CM

## 2021-08-05 DIAGNOSIS — E559 Vitamin D deficiency, unspecified: Secondary | ICD-10-CM

## 2021-08-05 DIAGNOSIS — Z0000 Encounter for general adult medical examination without abnormal findings: Secondary | ICD-10-CM

## 2021-08-05 LAB — BASIC METABOLIC PANEL
BUN: 12 mg/dL (ref 6–23)
CO2: 28 mEq/L (ref 19–32)
Calcium: 9.9 mg/dL (ref 8.4–10.5)
Chloride: 102 mEq/L (ref 96–112)
Creatinine, Ser: 1 mg/dL (ref 0.40–1.50)
GFR: 82.59 mL/min (ref >60.00)
Glucose, Bld: 112 mg/dL — ABNORMAL HIGH (ref 70–99)
Potassium: 4.3 mEq/L (ref 3.5–5.1)
Sodium: 138 mEq/L (ref 135–145)

## 2021-08-05 LAB — CBC WITH DIFFERENTIAL/PLATELET
Basophils Absolute: 0.1 10*3/uL (ref 0.0–0.1)
Basophils Relative: 0.6 % (ref 0.0–3.0)
Eosinophils Absolute: 0.1 10*3/uL (ref 0.0–0.7)
Eosinophils Relative: 1.6 % (ref 0.0–5.0)
HCT: 44.7 % (ref 39.0–52.0)
Hemoglobin: 15.5 g/dL (ref 13.0–17.0)
Lymphocytes Relative: 31.5 % (ref 12.0–46.0)
Lymphs Abs: 2.8 10*3/uL (ref 0.7–4.0)
MCHC: 34.7 g/dL (ref 30.0–36.0)
MCV: 93.4 fl (ref 78.0–100.0)
Monocytes Absolute: 0.7 10*3/uL (ref 0.1–1.0)
Monocytes Relative: 7.9 % (ref 3.0–12.0)
Neutro Abs: 5.2 10*3/uL (ref 1.4–7.7)
Neutrophils Relative %: 58.4 % (ref 43.0–77.0)
Platelets: 220 10*3/uL (ref 150.0–400.0)
RBC: 4.79 Mil/uL (ref 4.22–5.81)
RDW: 12.8 % (ref 11.5–15.5)
WBC: 8.9 10*3/uL (ref 4.0–10.5)

## 2021-08-05 LAB — URINALYSIS, ROUTINE W REFLEX MICROSCOPIC
Bilirubin Urine: NEGATIVE
Hgb urine dipstick: NEGATIVE
Ketones, ur: NEGATIVE
Leukocytes,Ua: NEGATIVE
Nitrite: NEGATIVE
RBC / HPF: NONE SEEN (ref 0–?)
Specific Gravity, Urine: 1.025 (ref 1.000–1.030)
Total Protein, Urine: NEGATIVE
Urine Glucose: NEGATIVE
Urobilinogen, UA: 0.2 (ref 0.0–1.0)
pH: 5.5 (ref 5.0–8.0)

## 2021-08-05 LAB — LIPID PANEL
Cholesterol: 88 mg/dL (ref 0–200)
HDL: 38 mg/dL — ABNORMAL LOW (ref >39.00)
LDL Cholesterol: 18 mg/dL (ref 0–99)
NonHDL: 49.97
Total CHOL/HDL Ratio: 2
Triglycerides: 161 mg/dL — ABNORMAL HIGH (ref 0.0–149.0)
VLDL: 32.2 mg/dL (ref 0.0–40.0)

## 2021-08-05 LAB — MICROALBUMIN / CREATININE URINE RATIO
Creatinine,U: 98.7 mg/dL
Microalb Creat Ratio: 0.7 mg/g (ref 0.0–30.0)
Microalb, Ur: <0.7 mg/dL (ref 0.0–1.9)

## 2021-08-05 LAB — HEPATIC FUNCTION PANEL
ALT: 69 U/L — ABNORMAL HIGH (ref 0–53)
AST: 31 U/L (ref 0–37)
Albumin: 4.4 g/dL (ref 3.5–5.2)
Alkaline Phosphatase: 63 U/L (ref 39–117)
Bilirubin, Direct: 0.2 mg/dL (ref 0.0–0.3)
Total Bilirubin: 0.8 mg/dL (ref 0.2–1.2)
Total Protein: 7.2 g/dL (ref 6.0–8.3)

## 2021-08-05 LAB — HEMOGLOBIN A1C: Hgb A1c MFr Bld: 6.8 % — ABNORMAL HIGH (ref 4.6–6.5)

## 2021-08-05 LAB — VITAMIN D 25 HYDROXY (VIT D DEFICIENCY, FRACTURES): VITD: 48.31 ng/mL (ref 30.00–100.00)

## 2021-08-05 LAB — TSH: TSH: 1.27 u[IU]/mL (ref 0.35–5.50)

## 2021-08-05 LAB — PSA: PSA: 0.71 ng/mL (ref 0.10–4.00)

## 2021-08-05 LAB — VITAMIN B12: Vitamin B-12: 877 pg/mL (ref 211–911)

## 2021-08-06 ENCOUNTER — Other Ambulatory Visit: Payer: Self-pay | Admitting: Internal Medicine

## 2021-08-06 ENCOUNTER — Ambulatory Visit: Payer: BC Managed Care – PPO | Admitting: Internal Medicine

## 2021-08-06 ENCOUNTER — Encounter: Payer: Self-pay | Admitting: Internal Medicine

## 2021-08-06 VITALS — BP 128/70 | HR 83 | Temp 98.2°F | Ht 71.0 in | Wt 250.0 lb

## 2021-08-06 DIAGNOSIS — E1165 Type 2 diabetes mellitus with hyperglycemia: Secondary | ICD-10-CM | POA: Diagnosis not present

## 2021-08-06 DIAGNOSIS — E538 Deficiency of other specified B group vitamins: Secondary | ICD-10-CM

## 2021-08-06 DIAGNOSIS — M79605 Pain in left leg: Secondary | ICD-10-CM

## 2021-08-06 DIAGNOSIS — Z0001 Encounter for general adult medical examination with abnormal findings: Secondary | ICD-10-CM | POA: Diagnosis not present

## 2021-08-06 DIAGNOSIS — R9431 Abnormal electrocardiogram [ECG] [EKG]: Secondary | ICD-10-CM | POA: Diagnosis not present

## 2021-08-06 DIAGNOSIS — I10 Essential (primary) hypertension: Secondary | ICD-10-CM

## 2021-08-06 DIAGNOSIS — M79604 Pain in right leg: Secondary | ICD-10-CM

## 2021-08-06 DIAGNOSIS — E7800 Pure hypercholesterolemia, unspecified: Secondary | ICD-10-CM

## 2021-08-06 DIAGNOSIS — E559 Vitamin D deficiency, unspecified: Secondary | ICD-10-CM

## 2021-08-06 MED ORDER — METFORMIN HCL ER 500 MG PO TB24: ORAL_TABLET | ORAL | 3 refills | Status: DC

## 2021-08-06 MED ORDER — OZEMPIC (0.25 OR 0.5 MG/DOSE) 2 MG/1.5ML ~~LOC~~ SOPN
2.0000 mg | PEN_INJECTOR | SUBCUTANEOUS | 3 refills | Status: DC
Start: 2021-08-06 — End: 2021-08-07

## 2021-08-06 MED ORDER — ROSUVASTATIN CALCIUM 10 MG PO TABS: 10.0000 mg | ORAL_TABLET | Freq: Every day | ORAL | 3 refills | Status: DC

## 2021-08-06 NOTE — Progress Notes (Unsigned)
Patient ID: Brendan Washington, male   DOB: 05/04/1960, 61 y.o.   MRN: [id number]         Chief Complaint:: wellness exam and Follow-up  Cold legs, dm, hld, htn       HPI:  Brendan Washington is a 61 y.o. male here for wellness exam; decliens cologuard for now, plans to call himself for yearly eye exam soon, declines shinigrix, o/w up to date                        Also admits to occasional missing med such as metformin.   Pt denies chest pain, increased sob or doe, wheezing, orthopnea, PND, increased LE swelling, palpitations, dizziness or syncope, but does have bialteral leg coldness at times with exertion    Pt denies polydipsia, polyuria, or new focal neuro s/s.    Pt denies fever, wt loss, night sweats, loss of appetite, or other constitutional symptoms  Pt interested in Card CT score.  Hard to lose wt with diet and activity   Wt Readings from Last 3 Encounters:  08/06/21 250 lb (113.4 kg)  01/30/21 244 lb 3.2 oz (110.8 kg)  07/31/20 241 lb (109.3 kg)   BP Readings from Last 3 Encounters:  08/06/21 128/70  01/30/21 130/70  07/31/20 130/74   Immunization History  Administered Date(s) Administered   Influenza,inj,Quad PF,6+ Mos 12/25/2017   There are no preventive care reminders to display for this patient.     Past Medical History:  Diagnosis Date   Hypercholesteremia    Hyperglycemia 07/31/2015   Hyperlipidemia 07/31/2015   Past Surgical History:  Procedure Laterality Date   BILATERAL CARPAL TUNNEL RELEASE     CARPAL TUNNEL WITH CUBITAL TUNNEL      reports that he quit smoking about 36 years ago. His smoking use included cigarettes. He has never used smokeless tobacco. He reports current alcohol use. He reports that he does not use drugs. family history includes Healthy in his brother; Heart disease in his father; Kidney disease in his father; Parkinson's disease in his mother; Stroke in his father. Allergies  Allergen Reactions   Penicillins    Tetanus Toxoid     REACTION:  arm swelling   Current Outpatient Medications on File Prior to Visit  Medication Sig Dispense Refill   Ascorbic Acid (VITAMIN C) 1000 MG tablet Take 1,000 mg by mouth daily.     aspirin EC 81 MG tablet Take 1 tablet (81 mg total) by mouth daily. 90 tablet 11   B Complex-C (SUPER B COMPLEX PO) Take 1 tablet by mouth daily.     Chromium 1000 MCG TABS Take 1 tablet by mouth daily.     CINNAMON PO Take 1,000 mg by mouth daily.     ELDERBERRY PO Take 1 tablet by mouth daily.     glucose blood (COOL BLOOD GLUCOSE TEST STRIPS) test strip Use as instructed to check blood sugar daily. DX: E11.9 100 each 12   NON FORMULARY Take 1 tablet by mouth daily. [facility]     omeprazole (PRILOSEC) 20 MG capsule Take 1 capsule (20 mg total) by mouth daily. 30 capsule 11   pyridoxine (B-6) 100 MG tablet Take 100 mg by mouth daily.     TURMERIC CURCUMIN PO Take 1,500 mg by mouth daily.     vitamin B-12 (CYANOCOBALAMIN) 500 MCG tablet Take 500 mcg by mouth daily.     No current facility-administered medications on file prior  to visit.        ROS:  All others reviewed and negative.  Objective        PE:  BP 128/70 (BP Location: Right Arm, Patient Position: Sitting, Cuff Size: Large)   Pulse 83   Temp 98.2 F (36.8 C) (Oral)   Ht 5\' 11"  (1.803 m)   Wt 250 lb (113.4 kg)   SpO2 95%   BMI 34.87 kg/m                 Constitutional: Pt appears in NAD               HENT: Head: NCAT.                Right Ear: External ear normal.                 Left Ear: External ear normal.                Eyes: . Pupils are equal, round, and reactive to light. Conjunctivae and EOM are normal               Nose: without d/c or deformity               Neck: Neck supple. Gross normal ROM               Cardiovascular: Normal rate and regular rhythm.                 Pulmonary/Chest: Effort normal and breath sounds without rales or wheezing.                Abd:  Soft, NT, ND, + BS, no organomegaly                Neurological: Pt is alert. At baseline orientation, motor grossly intact               Skin: Skin is warm. No rashes, no other new lesions, LE edema - none, dorsalis pedis trace bialteral only               Psychiatric: Pt behavior is normal without agitation   Micro: none  Cardiac tracings I have personally interpreted today:  none  Pertinent Radiological findings (summarize): none   Lab Results  Component Value Date   WBC 8.9 08/05/2021   HGB 15.5 08/05/2021   HCT 44.7 08/05/2021   PLT 220.0 08/05/2021   GLUCOSE 112 (H) 08/05/2021   CHOL 88 08/05/2021   TRIG 161.0 (H) 08/05/2021   HDL 38.00 (L) 08/05/2021   LDLDIRECT 46.0 07/31/2020   LDLCALC 18 08/05/2021   ALT 69 (H) 08/05/2021   AST 31 08/05/2021   NA 138 08/05/2021   K 4.3 08/05/2021   CL 102 08/05/2021   CREATININE 1.00 08/05/2021   BUN 12 08/05/2021   CO2 28 08/05/2021   TSH 1.27 08/05/2021   PSA 0.71 08/05/2021   HGBA1C 6.8 (H) 08/05/2021   MICROALBUR <0.7 08/05/2021   Assessment/Plan:  Brendan Washington is a 61 y.o. White or Caucasian [1] male with  has a past medical history of Hypercholesteremia, Hyperglycemia (07/31/2015), and Hyperlipidemia (07/31/2015).  Encounter for well adult exam with abnormal findings Age and sex appropriate education and counseling updated with regular exercise and diet Referrals for preventative services - pt to call for eye exam Immunizations addressed - declines covid booster, shingrix Smoking counseling  - none needed Evidence for depression or other mood disorder - none significant  Most recent labs reviewed. I have personally reviewed and have noted: 1) the patient's medical and social history 2) The patient's current medications and supplements 3) The patient's height, weight, and BMI have been recorded in the chart   Bilateral leg pain Uncontroleled mild intermitetn in last month  - cant r/o vascular insufficiency, for arterial study  Diabetes (HCC) Lab Results   Component Value Date   HGBA1C 6.8 (H) 08/05/2021   Mild uncontrolled,, pt to continue current medical treatment metformin ER 500 - 1 qam, but also for ozempic 2 mg weekly,  to f/u any worsening symptoms or concerns   Hyperlipidemia Lab Results  Component Value Date   LDLCALC 18 08/05/2021   Overcontrolled, , pt to decrease current statin crestor to 10 mg qd, also for cardiac CT score  Vitamin D deficiency Last vitamin D Lab Results  Component Value Date   VD25OH 48.31 08/05/2021   Stable, cont oral replacement  Followup: Return in about 6 months (around 02/05/2022).  [name], MD 08/07/2021 10:38 PM [facility]

## 2021-08-06 NOTE — Patient Instructions (Addendum)
Please call Dr Dan Humphreys for your yearly eye exam  Please remember to take your metformin every day  Ok to decrease the crestor to 10 mg per day  Please continue all other medications as before, and refills have been done if requested.  Please have the pharmacy call with any other refills you may need.  Please continue your efforts at being more active, low cholesterol diet, and weight control.  You are otherwise up to date with prevention measures today.  Please keep your appointments with your specialists as you may have planned  You will be contacted regarding the referral for: Cardiac CT score, and Leg circulation testing  Please make an Appointment to return in 6 months, or sooner if needed, also with Lab Appointment for testing done 3-5 days before at the FIRST FLOOR Lab (so this is for TWO appointments - please see the scheduling desk as you leave)  Due to the ongoing Covid 19 pandemic, our lab now requires an appointment for any labs done at our office.  If you need labs done and do not have an appointment, please call our office ahead of time to schedule before presenting to the lab for your testing.

## 2021-08-07 ENCOUNTER — Encounter: Payer: Self-pay | Admitting: Internal Medicine

## 2021-08-07 DIAGNOSIS — M79604 Pain in right leg: Secondary | ICD-10-CM | POA: Insufficient documentation

## 2021-08-07 NOTE — Assessment & Plan Note (Signed)
Lab Results  Component Value Date   HGBA1C 6.8 (H) 08/05/2021   Mild uncontrolled,, pt to continue current medical treatment metformin ER 500 - 1 qam, but also for ozempic 2 mg weekly,  to f/u any worsening symptoms or concerns

## 2021-08-07 NOTE — Assessment & Plan Note (Signed)
Last vitamin D Lab Results  Component Value Date   VD25OH 48.31 08/05/2021   Stable, cont oral replacement  

## 2021-08-07 NOTE — Assessment & Plan Note (Addendum)
Lab Results  Component Value Date   LDLCALC 18 08/05/2021   Overcontrolled, , pt to decrease current statin crestor to 10 mg qd, also for cardiac CT score

## 2021-08-07 NOTE — Assessment & Plan Note (Signed)
Age and sex appropriate education and counseling updated with regular exercise and diet Referrals for preventative services - pt to call for eye exam Immunizations addressed - declines covid booster, shingrix Smoking counseling  - none needed Evidence for depression or other mood disorder - none significant Most recent labs reviewed. I have personally reviewed and have noted: 1) the patient's medical and social history 2) The patient's current medications and supplements 3) The patient's height, weight, and BMI have been recorded in the chart

## 2021-08-07 NOTE — Assessment & Plan Note (Signed)
Uncontroleled mild intermitetn in last month  - cant r/o vascular insufficiency, for arterial study

## 2021-08-14 ENCOUNTER — Ambulatory Visit (HOSPITAL_COMMUNITY)
Admission: RE | Admit: 2021-08-14 | Discharge: 2021-08-14 | Disposition: A | Discharge status: Home / Self Care | Payer: BC Managed Care – PPO | Source: Ambulatory Visit | Attending: Internal Medicine | Admitting: Internal Medicine

## 2021-08-14 ENCOUNTER — Ambulatory Visit
Admission: RE | Admit: 2021-08-14 | Discharge: 2021-08-14 | Disposition: A | Discharge status: Home / Self Care | Payer: BC Managed Care – PPO | Source: Ambulatory Visit | Attending: Internal Medicine | Admitting: Internal Medicine

## 2021-08-14 DIAGNOSIS — M79604 Pain in right leg: Secondary | ICD-10-CM | POA: Insufficient documentation

## 2021-08-14 DIAGNOSIS — E7800 Pure hypercholesterolemia, unspecified: Secondary | ICD-10-CM | POA: Diagnosis not present

## 2021-08-14 DIAGNOSIS — E1165 Type 2 diabetes mellitus with hyperglycemia: Secondary | ICD-10-CM

## 2021-08-14 DIAGNOSIS — M79605 Pain in left leg: Secondary | ICD-10-CM | POA: Insufficient documentation

## 2021-08-14 DIAGNOSIS — I10 Essential (primary) hypertension: Secondary | ICD-10-CM

## 2021-08-14 DIAGNOSIS — R9431 Abnormal electrocardiogram [ECG] [EKG]: Secondary | ICD-10-CM

## 2021-08-14 NOTE — Progress Notes (Signed)
ABI completed. Refer to "CV Proc" under chart review to view preliminary results.  08/14/2021 8:39 AM Eula Fried., MHA, RVT, RDCS, RDMS  on behalf of Magdal[facility] RVT

## 2021-08-20 ENCOUNTER — Other Ambulatory Visit: Payer: Self-pay | Admitting: Internal Medicine

## 2021-08-20 DIAGNOSIS — R931 Abnormal findings on diagnostic imaging of heart and coronary circulation: Secondary | ICD-10-CM

## 2021-08-20 MED ORDER — ROSUVASTATIN CALCIUM 20 MG PO TABS: 20.0000 mg | ORAL_TABLET | Freq: Every day | ORAL | 3 refills | Status: DC

## 2021-09-15 NOTE — Progress Notes (Unsigned)
Cardiology Office Note   Date:  09/16/2021   ID:  Brendan Washington, DOB [birth year], MRN [id number]  PCP:  Corwin Levins, MD    No chief complaint on file.  Coronary artery calcium  Wt Readings from Last 3 Encounters:  09/16/21 249 lb (112.9 kg)  08/06/21 250 lb (113.4 kg)  01/30/21 244 lb 3.2 oz (110.8 kg)       History of Present Illness: Brendan Washington is a 61 y.o. male who is being seen today for the evaluation of elevated coronary calcium score at the request of Corwin Levins, MD.   Father had an MI at age 57, died at 46.  Multiple PCIs, CABG.  Also PAD.    THe patient smoked or 6 or 7 years, quit in 1987.  6/23 CT showed: "Coronary Calcium Score:   Left main: 0   Left anterior descending artery: 169   Left circumflex artery: 0   Right coronary artery: 49   Total: 218   Percentile: 82   Pericardium: Normal.   Aorta: Normal caliber of ascending aorta. Aortic atherosclerosis noted."  He stays active on his job.  He pours concrete.    Denies : Chest pain. Dizziness. Leg edema. Nitroglycerin use. Orthopnea. Palpitations. Paroxysmal nocturnal dyspnea. Shortness of breath. Syncope.    His daughter uses a Building control surveyor and is very regular with this activity.  He knows he should try to be more regular with exercise, but given how strenuous his job is, sometimes it is difficult.  Denies : Chest pain. Dizziness. Leg edema. Nitroglycerin use. Orthopnea. Palpitations. Paroxysmal nocturnal dyspnea. Shortness of breath. Syncope.    Past Medical History:  Diagnosis Date   Hypercholesteremia    Hyperglycemia 07/31/2015   Hyperlipidemia 07/31/2015    Past Surgical History:  Procedure Laterality Date   BILATERAL CARPAL TUNNEL RELEASE     CARPAL TUNNEL WITH CUBITAL TUNNEL       Current Outpatient Medications  Medication Sig Dispense Refill   Ascorbic Acid (VITAMIN C) 1000 MG tablet Take 1,000 mg by mouth daily.     aspirin EC 81 MG tablet Take 1 tablet (81 mg  total) by mouth daily. 90 tablet 11   B Complex-C (SUPER B COMPLEX PO) Take 1 tablet by mouth daily.     Chromium 1000 MCG TABS Take 1 tablet by mouth daily.     CINNAMON PO Take 1,000 mg by mouth daily.     ELDERBERRY PO Take 1 tablet by mouth daily.     glucose blood (COOL BLOOD GLUCOSE TEST STRIPS) test strip Use as instructed to check blood sugar daily. DX: E11.9 100 each 12   metFORMIN (GLUCOPHAGE-XR) 500 MG 24 hr tablet TAKE 1 TABLETS BY MOUTH ONCE DAILY WITH BREAKFAST 90 tablet 3   NON FORMULARY Take 1 tablet by mouth daily. [facility]     OZEMPIC, 2 MG/DOSE, 8 MG/3ML SOPN INJECT 1 DOSE ONCE A WEEK 9 mL 3   pyridoxine (B-6) 100 MG tablet Take 100 mg by mouth daily.     rosuvastatin (CRESTOR) 20 MG tablet Take 1 tablet (20 mg total) by mouth daily. 90 tablet 3   TURMERIC CURCUMIN PO Take 1,500 mg by mouth daily.     vitamin B-12 (CYANOCOBALAMIN) 500 MCG tablet Take 500 mcg by mouth daily.     No current facility-administered medications for this visit.    Allergies:   Tetanus toxoid and Penicillins    Social History:  The patient  reports that he quit smoking about 36 years ago. His smoking use included cigarettes. He has never used smokeless tobacco. He reports current alcohol use. He reports that he does not use drugs.   Family History:  The patient's family history includes Healthy in his brother, daughter, and son; Heart disease in his father; Kidney disease in his father; Parkinson's disease in his mother; Stroke in his father.    ROS:  Please see the history of present illness.   Otherwise, review of systems are positive for recent increase in A1c from 6.0-6.8.   All other systems are reviewed and negative.    PHYSICAL EXAM: VS:  BP 132/72   Pulse 87   Ht 5\' 10"  (1.778 m)   Wt 249 lb (112.9 kg)   SpO2 96%   BMI 35.73 kg/m  , BMI Body mass index is 35.73 kg/m. GEN: Well nourished, well developed, in no acute distress HEENT: normal Neck: no JVD, carotid  bruits, or masses Cardiac: RRR; no murmurs, rubs, or gallops,no edema  Respiratory:  clear to auscultation bilaterally, normal work of breathing GI: soft, nontender, nondistended, + BS, obese MS: no deformity or atrophy; 2+ PT pulses bilaterally Skin: warm and dry, no rash Neuro:  Strength and sensation are intact Psych: euthymic mood, full affect   EKG:   The ekg ordered today demonstrates NSR, no ST changes, left axis deviation   Recent Labs: 08/05/2021: ALT 69; BUN 12; Creatinine, Ser 1.00; Hemoglobin 15.5; Platelets 220.0; Potassium 4.3; Sodium 138; TSH 1.27   Lipid Panel    Component Value Date/Time   CHOL 88 08/05/2021 1105   TRIG 161.0 (H) 08/05/2021 1105   HDL 38.00 (L) 08/05/2021 1105   CHOLHDL 2 08/05/2021 1105   VLDL 32.2 08/05/2021 1105   LDLCALC 18 08/05/2021 1105   LDLDIRECT 46.0 07/31/2020 1701     Other studies Reviewed: Additional studies/ records that were reviewed today with results demonstrating: labs reviewed.   ASSESSMENT AND PLAN:  Coronary calcification: Total calcium score of 218, which puts him at intermediate risk of cardiac event.  The absolute score is not that high.  No symptoms of angina.  He quit smoking many years ago.  No carotid bruits.  He has palpable posterior tibial pulses in both lower extremities.  We will not pursue any further cardiac testing at this time unless he develops symptoms.  We talked about increasing activity to keep up a sustained heart rate for 30 minutes. Aortic atherosclerosis: Continue lipid-lowering therapy. Diabetes: A1c 6.8.  Continue medical therapy. Hyperlipidemia: Continue rosuvastatin 20 mg daily.  This should also protect against further atherosclerosis.  LDL was 18 in June 2023.  We spoke at length about a whole food, plant-based diet.  High-fiber diet.  Avoid processed foods.   Current medicines are reviewed at length with the patient today.  The patient concerns regarding his medicines were addressed.  The  following changes have been made:  No change  Labs/ tests ordered today include:  No orders of the defined types were placed in this encounter.   Recommend 150 minutes/week of aerobic exercise Low fat, low carb, high fiber diet recommended  Disposition:   FU in 1 year   Signed, Lance Muss, MD  09/16/2021 4:28 PM    [facility] [address] [place], [place], [place]  [address] Phone: [phone]; Fax: [phone]

## 2021-09-16 ENCOUNTER — Encounter: Payer: Self-pay | Admitting: Interventional Cardiology

## 2021-09-16 ENCOUNTER — Ambulatory Visit: Payer: BC Managed Care – PPO | Admitting: Interventional Cardiology

## 2021-09-16 VITALS — BP 132/72 | HR 87 | Ht 70.0 in | Wt 249.0 lb

## 2021-09-16 DIAGNOSIS — I700 Atherosclerosis of aorta: Secondary | ICD-10-CM

## 2021-09-16 DIAGNOSIS — E782 Mixed hyperlipidemia: Secondary | ICD-10-CM | POA: Diagnosis not present

## 2021-09-16 DIAGNOSIS — E118 Type 2 diabetes mellitus with unspecified complications: Secondary | ICD-10-CM

## 2021-09-16 DIAGNOSIS — I2584 Coronary atherosclerosis due to calcified coronary lesion: Secondary | ICD-10-CM | POA: Diagnosis not present

## 2021-09-16 DIAGNOSIS — I2510 Atherosclerotic heart disease of native coronary artery without angina pectoris: Secondary | ICD-10-CM | POA: Diagnosis not present

## 2021-09-16 NOTE — Patient Instructions (Signed)
Medication Instructions:  Your physician recommends that you continue on your current medications as directed. Please refer to the Current Medication list given to you today.  *If you need a refill on your cardiac medications before your next appointment, please call your pharmacy*   Lab Work: none If you have labs (blood work) drawn today and your tests are completely normal, you will receive your results only by: MyChart Message (if you have MyChart) OR A paper copy in the mail If you have any lab test that is abnormal or we need to change your treatment, we will call you to review the results.   Testing/Procedures: none   Follow-Up: At [facility], you and your health needs are our priority.  As part of our continuing mission to provide you with exceptional heart care, we have created designated Provider Care Teams.  These Care Teams include your primary Cardiologist (physician) and Advanced Practice Providers (APPs -  Physician Assistants and Nurse Practitioners) who all work together to provide you with the care you need, when you need it.  We recommend signing up for the patient portal called "MyChart".  Sign up information is provided on this After Visit Summary.  MyChart is used to connect with patients for Virtual Visits (Telemedicine).  Patients are able to view lab/test results, encounter notes, upcoming appointments, etc.  Non-urgent messages can be sent to your provider as well.   To learn more about what you can do with MyChart, go to [email or web address].    Your next appointment:   12 month(s)  The format for your next appointment:   In Person  Provider:   Jayadeep Varanasi, MD     Other Instructions  High-Fiber Eating Plan Fiber, also called dietary fiber, is a type of carbohydrate. It is found foods such as fruits, vegetables, whole grains, and beans. A high-fiber diet can have many health benefits. Your health care provider may recommend a high-fiber diet  to help: Prevent constipation. Fiber can make your bowel movements more regular. Lower your cholesterol. Relieve the following conditions: Inflammation of veins in the anus (hemorrhoids). Inflammation of specific areas of the digestive tract (uncomplicated diverticulosis). A problem of the large intestine, also called the colon, that sometimes causes pain and diarrhea (irritable bowel syndrome, or IBS). Prevent overeating as part of a weight-loss plan. Prevent heart disease, type 2 diabetes, and certain cancers. What are tips for following this plan? Reading food labels  Check the nutrition facts label on food products for the amount of dietary fiber. Choose foods that have 5 grams of fiber or more per serving. The goals for recommended daily fiber intake include: Men (age 50 or younger): 34-38 g. Men (over age 50): 28-34 g. Women (age 50 or younger): 25-28 g. Women (over age 50): 22-25 g. Your daily fiber goal is _____________ g. Shopping Choose whole fruits and vegetables instead of processed forms, such as apple juice or applesauce. Choose a wide variety of high-fiber foods such as avocados, lentils, oats, and kidney beans. Read the nutrition facts label of the foods you choose. Be aware of foods with added fiber. These foods often have high sugar and sodium amounts per serving. Cooking Use whole-grain flour for baking and cooking. Cook with brown rice instead of white rice. Meal planning Start the day with a breakfast that is high in fiber, such as a cereal that contains 5 g of fiber or more per serving. Eat breads and cereals that are made with whole-grain flour instead of   refined flour or white flour. Eat brown rice, bulgur wheat, or millet instead of white rice. Use beans in place of meat in soups, salads, and pasta dishes. Be sure that half of the grains you eat each day are whole grains. General information You can get the recommended daily intake of dietary fiber  by: Eating a variety of fruits, vegetables, grains, nuts, and beans. Taking a fiber supplement if you are not able to take in enough fiber in your diet. It is better to get fiber through food than from a supplement. Gradually increase how much fiber you consume. If you increase your intake of dietary fiber too quickly, you may have bloating, cramping, or gas. Drink plenty of water to help you digest fiber. Choose high-fiber snacks, such as berries, raw vegetables, nuts, and popcorn. What foods should I eat? Fruits Berries. Pears. Apples. Oranges. Avocado. Prunes and raisins. Dried figs. Vegetables Sweet potatoes. Spinach. Kale. Artichokes. Cabbage. Broccoli. Cauliflower. Green peas. Carrots. Squash. Grains Whole-grain breads. Multigrain cereal. Oats and oatmeal. Brown rice. Barley. Bulgur wheat. Millet. Quinoa. Bran muffins. Popcorn. Rye wafer crackers. Meats and other proteins Navy beans, kidney beans, and pinto beans. Soybeans. Split peas. Lentils. Nuts and seeds. Dairy Fiber-fortified yogurt. Beverages Fiber-fortified soy milk. Fiber-fortified orange juice. Other foods Fiber bars. The items listed above may not be a complete list of recommended foods and beverages. Contact a dietitian for more information. What foods should I avoid? Fruits Fruit juice. Cooked, strained fruit. Vegetables Fried potatoes. Canned vegetables. Well-cooked vegetables. Grains White bread. Pasta made with refined flour. White rice. Meats and other proteins Fatty cuts of meat. Fried chicken or fried fish. Dairy Milk. Yogurt. Cream cheese. Sour cream. Fats and oils Butters. Beverages Soft drinks. Other foods Cakes and pastries. The items listed above may not be a complete list of foods and beverages to avoid. Talk with your dietitian about what choices are best for you. Summary Fiber is a type of carbohydrate. It is found in foods such as fruits, vegetables, whole grains, and beans. A high-fiber  diet has many benefits. It can help to prevent constipation, lower blood cholesterol, aid weight loss, and reduce your risk of heart disease, diabetes, and certain cancers. Increase your intake of fiber gradually. Increasing fiber too quickly may cause cramping, bloating, and gas. Drink plenty of water while you increase the amount of fiber you consume. The best sources of fiber include whole fruits and vegetables, whole grains, nuts, seeds, and beans. This information is not intended to replace advice given to you by your health care provider. Make sure you discuss any questions you have with your health care provider. Document Revised: 06/23/2019 Document Reviewed: 06/23/2019 Elsevier Patient Education  2023 Elsevier Inc.   Important Information About Sugar       

## 2021-12-18 DIAGNOSIS — L821 Other seborrheic keratosis: Secondary | ICD-10-CM | POA: Diagnosis not present

## 2021-12-18 DIAGNOSIS — L300 Nummular dermatitis: Secondary | ICD-10-CM | POA: Diagnosis not present

## 2021-12-18 DIAGNOSIS — D485 Neoplasm of uncertain behavior of skin: Secondary | ICD-10-CM | POA: Diagnosis not present

## 2021-12-30 ENCOUNTER — Telehealth: Payer: Self-pay | Admitting: Internal Medicine

## 2021-12-30 NOTE — Telephone Encounter (Signed)
Patient would like to know if we have any samples of Ozempic 8mg .  Patient should have had his injection yesterday, but pharmacies have it on back order. Please advise.

## 2021-12-31 NOTE — Telephone Encounter (Signed)
Informed patient that we do not  have samples on hand in the office. He states that he is completely out and asks what is he supposed to do. I let him know that I would call him back with assistance.

## 2022-02-05 ENCOUNTER — Encounter: Payer: Self-pay | Admitting: Internal Medicine

## 2022-02-05 ENCOUNTER — Ambulatory Visit: Payer: BC Managed Care – PPO | Admitting: Internal Medicine

## 2022-02-05 VITALS — BP 128/80 | HR 85 | Temp 98.2°F | Ht 70.0 in | Wt 253.0 lb

## 2022-02-05 DIAGNOSIS — R21 Rash and other nonspecific skin eruption: Secondary | ICD-10-CM

## 2022-02-05 DIAGNOSIS — E782 Mixed hyperlipidemia: Secondary | ICD-10-CM

## 2022-02-05 DIAGNOSIS — Z125 Encounter for screening for malignant neoplasm of prostate: Secondary | ICD-10-CM

## 2022-02-05 DIAGNOSIS — E559 Vitamin D deficiency, unspecified: Secondary | ICD-10-CM | POA: Diagnosis not present

## 2022-02-05 DIAGNOSIS — I10 Essential (primary) hypertension: Secondary | ICD-10-CM

## 2022-02-05 DIAGNOSIS — E538 Deficiency of other specified B group vitamins: Secondary | ICD-10-CM | POA: Diagnosis not present

## 2022-02-05 DIAGNOSIS — E1165 Type 2 diabetes mellitus with hyperglycemia: Secondary | ICD-10-CM

## 2022-02-05 MED ORDER — CLOTRIMAZOLE-BETAMETHASONE 1-0.05 % EX CREA: 1.0000 | TOPICAL_CREAM | Freq: Every day | CUTANEOUS | 0 refills | Status: DC

## 2022-02-05 NOTE — Patient Instructions (Signed)
Please take all new medication as prescribed - the cream for the rash  We will check on the ozempic samples  Please continue all other medications as before, and refills have been done if requested.  Please have the pharmacy call with any other refills you may need.  Please continue your efforts at being more active, low cholesterol diet, and weight control.  Please keep your appointments with your specialists as you may have planned  Please go to the LAB at the blood drawing area for the tests to be done  You will be contacted by phone if any changes need to be made immediately.  Otherwise, you will receive a letter about your results with an explanation, but please check with MyChart first.  Please remember to sign up for MyChart if you have not done so, as this will be important to you in the future with finding out test results, communicating by private email, and scheduling acute appointments online when needed.  Please make an Appointment to return in 6 months, or sooner if needed

## 2022-02-05 NOTE — Assessment & Plan Note (Signed)
Lab Results  Component Value Date   HGBA1C 6.8 (H) 08/05/2021   Stable, pt to continue current medical treatment metromrin ER 500 gm - 1 qd and ozempic 2 mg weekly

## 2022-02-05 NOTE — Progress Notes (Signed)
Patient ID: Brendan Washington, male   DOB: December 12, 1960, 61 y.o.   MRN: [id number]        Chief Complaint: follow up HTN, HLD and DM, buttock rash       HPI:  Brendan Washington is a 61 y.o. male here with c/o new osnet 1 mo left buttock superficial nontender erythem itchy erythema, somewhat better with otc lotrimin but not resolved.  Pt denies chest pain, increased sob or doe, wheezing, orthopnea, PND, increased LE swelling, palpitations, dizziness or syncope.   Pt denies polydipsia, polyuria, or new focal neuro s/s.    Pt denies fever, wt loss, night sweats, loss of appetite, or other constitutional symptoms  Has been out of ozempic for 3 wks, asking for samples.   Wt Readings from Last 3 Encounters:  02/05/22 253 lb (114.8 kg)  09/16/21 249 lb (112.9 kg)  08/06/21 250 lb (113.4 kg)   BP Readings from Last 3 Encounters:  02/05/22 128/80  09/16/21 132/72  08/06/21 128/70         Past Medical History:  Diagnosis Date   Hypercholesteremia    Hyperglycemia 07/31/2015   Hyperlipidemia 07/31/2015   Past Surgical History:  Procedure Laterality Date   BILATERAL CARPAL TUNNEL RELEASE     CARPAL TUNNEL WITH CUBITAL TUNNEL      reports that he quit smoking about 36 years ago. His smoking use included cigarettes. He has never used smokeless tobacco. He reports current alcohol use. He reports that he does not use drugs. family history includes Healthy in his brother, daughter, and son; Heart disease in his father; Kidney disease in his father; Parkinson's disease in his mother; Stroke in his father. Allergies  Allergen Reactions   Tetanus Toxoid     REACTION: arm swelling   Penicillins    Current Outpatient Medications on File Prior to Visit  Medication Sig Dispense Refill   Ascorbic Acid (VITAMIN C) 1000 MG tablet Take 1,000 mg by mouth daily.     aspirin EC 81 MG tablet Take 1 tablet (81 mg total) by mouth daily. 90 tablet 11   B Complex-C (SUPER B COMPLEX PO) Take 1 tablet by mouth daily.      Chromium 1000 MCG TABS Take 1 tablet by mouth daily.     CINNAMON PO Take 1,000 mg by mouth daily.     ELDERBERRY PO Take 1 tablet by mouth daily.     glucose blood (COOL BLOOD GLUCOSE TEST STRIPS) test strip Use as instructed to check blood sugar daily. DX: E11.9 100 each 12   metFORMIN (GLUCOPHAGE-XR) 500 MG 24 hr tablet TAKE 1 TABLETS BY MOUTH ONCE DAILY WITH BREAKFAST 90 tablet 3   NON FORMULARY Take 1 tablet by mouth daily. [facility]     OZEMPIC, 2 MG/DOSE, 8 MG/3ML SOPN INJECT 1 DOSE ONCE A WEEK 9 mL 3   pyridoxine (B-6) 100 MG tablet Take 100 mg by mouth daily.     rosuvastatin (CRESTOR) 20 MG tablet Take 1 tablet (20 mg total) by mouth daily. 90 tablet 3   TURMERIC CURCUMIN PO Take 1,500 mg by mouth daily.     vitamin B-12 (CYANOCOBALAMIN) 500 MCG tablet Take 500 mcg by mouth daily.     No current facility-administered medications on file prior to visit.        ROS:  All others reviewed and negative.  Objective        PE:  BP 128/80 (BP Location: Right Arm, Patient Position: Sitting,  Cuff Size: Large)   Pulse 85   Temp 98.2 F (36.8 C) (Oral)   Ht 5\' 10"  (1.778 m)   Wt 253 lb (114.8 kg)   SpO2 98%   BMI 36.30 kg/m                 Constitutional: Pt appears in NAD               HENT: Head: NCAT.                Right Ear: External ear normal.                 Left Ear: External ear normal.                Eyes: . Pupils are equal, round, and reactive to light. Conjunctivae and EOM are normal               Nose: without d/c or deformity               Neck: Neck supple. Gross normal ROM               Cardiovascular: Normal rate and regular rhythm.                 Pulmonary/Chest: Effort normal and breath sounds without rales or wheezing.                Abd:  Soft, NT, ND, + BS, no organomegaly               Neurological: Pt is alert. At baseline orientation, motor grossly intact               Skin: Skin is warm, LE edema - none, has 4 cm oval nontender erythema  with defined edges but no ulcer, red streaks               Psychiatric: Pt behavior is normal without agitation   Micro: none  Cardiac tracings I have personally interpreted today:  none  Pertinent Radiological findings (summarize): none   Lab Results  Component Value Date   WBC 8.9 08/05/2021   HGB 15.5 08/05/2021   HCT 44.7 08/05/2021   PLT 220.0 08/05/2021   GLUCOSE 112 (H) 08/05/2021   CHOL 88 08/05/2021   TRIG 161.0 (H) 08/05/2021   HDL 38.00 (L) 08/05/2021   LDLDIRECT 46.0 07/31/2020   LDLCALC 18 08/05/2021   ALT 69 (H) 08/05/2021   AST 31 08/05/2021   NA 138 08/05/2021   K 4.3 08/05/2021   CL 102 08/05/2021   CREATININE 1.00 08/05/2021   BUN 12 08/05/2021   CO2 28 08/05/2021   TSH 1.27 08/05/2021   PSA 0.71 08/05/2021   HGBA1C 6.8 (H) 08/05/2021   MICROALBUR <0.7 08/05/2021   Assessment/Plan:  Brendan Washington is a 61 y.o. White or Caucasian [1] male with  has a past medical history of Hypercholesteremia, Hyperglycemia (07/31/2015), and Hyperlipidemia (07/31/2015).  Diabetes (HCC) Lab Results  Component Value Date   HGBA1C 6.8 (H) 08/05/2021   Stable, pt to continue current medical treatment metromrin ER 500 gm - 1 qd and ozempic 2 mg weekly   Essential hypertension BP Readings from Last 3 Encounters:  02/05/22 128/80  09/16/21 132/72  08/06/21 128/70   Stable, pt to continue medical treatment  - diet,wt control, low salt   Hyperlipidemia Lab Results  Component Value Date   LDLCALC 18 08/05/2021   Stable, pt to continue  current statin crestor 20 mg qd   Vitamin D deficiency Last vitamin D Lab Results  Component Value Date   VD25OH 48.31 08/05/2021   Stable, cont oral replacement   Rash Left buttock only, c/w ringworm, for lotrisone cr asd, consider derm referra if not imrproved  Followup: Return in about 6 months (around 08/07/2022).  [name], MD 02/05/2022 7:57 PM [facility]

## 2022-02-05 NOTE — Assessment & Plan Note (Signed)
Last vitamin D Lab Results  Component Value Date   VD25OH 48.31 08/05/2021   Stable, cont oral replacement

## 2022-02-05 NOTE — Assessment & Plan Note (Signed)
Left buttock only, c/w ringworm, for lotrisone cr asd, consider derm referra if not imrproved

## 2022-02-05 NOTE — Assessment & Plan Note (Signed)
BP Readings from Last 3 Encounters:  02/05/22 128/80  09/16/21 132/72  08/06/21 128/70   Stable, pt to continue medical treatment  - diet,wt control, low salt

## 2022-02-05 NOTE — Assessment & Plan Note (Signed)
Lab Results  Component Value Date   LDLCALC 18 08/05/2021   Stable, pt to continue current statin crestor 20 mg qd

## 2022-02-06 LAB — LIPID PANEL
Cholesterol: 87 mg/dL (ref 0–200)
HDL: 42.3 mg/dL (ref >39.00)
LDL Cholesterol: 16 mg/dL (ref 0–99)
NonHDL: 44.34
Total CHOL/HDL Ratio: 2
Triglycerides: 140 mg/dL (ref 0.0–149.0)
VLDL: 28 mg/dL (ref 0.0–40.0)

## 2022-02-06 LAB — BASIC METABOLIC PANEL
BUN: 15 mg/dL (ref 6–23)
CO2: 28 mEq/L (ref 19–32)
Calcium: 9.6 mg/dL (ref 8.4–10.5)
Chloride: 100 mEq/L (ref 96–112)
Creatinine, Ser: 1.16 mg/dL (ref 0.40–1.50)
GFR: 68.87 mL/min (ref >60.00)
Glucose, Bld: 105 mg/dL — ABNORMAL HIGH (ref 70–99)
Potassium: 4.1 mEq/L (ref 3.5–5.1)
Sodium: 136 mEq/L (ref 135–145)

## 2022-02-06 LAB — HEPATIC FUNCTION PANEL
ALT: 62 U/L — ABNORMAL HIGH (ref 0–53)
AST: 41 U/L — ABNORMAL HIGH (ref 0–37)
Albumin: 4.6 g/dL (ref 3.5–5.2)
Alkaline Phosphatase: 62 U/L (ref 39–117)
Bilirubin, Direct: 0.2 mg/dL (ref 0.0–0.3)
Total Bilirubin: 0.7 mg/dL (ref 0.2–1.2)
Total Protein: 7.7 g/dL (ref 6.0–8.3)

## 2022-02-06 LAB — VITAMIN D 25 HYDROXY (VIT D DEFICIENCY, FRACTURES): VITD: 49.62 ng/mL (ref 30.00–100.00)

## 2022-02-06 LAB — HEMOGLOBIN A1C: Hgb A1c MFr Bld: 7.4 % — ABNORMAL HIGH (ref 4.6–6.5)

## 2022-02-06 LAB — VITAMIN B12: Vitamin B-12: 1399 pg/mL — ABNORMAL HIGH (ref 211–911)

## 2022-02-09 ENCOUNTER — Other Ambulatory Visit: Payer: Self-pay | Admitting: Internal Medicine

## 2022-02-09 MED ORDER — METFORMIN HCL ER 500 MG PO TB24: ORAL_TABLET | ORAL | 3 refills | Status: DC

## 2022-03-02 DIAGNOSIS — R059 Cough, unspecified: Secondary | ICD-10-CM | POA: Diagnosis not present

## 2022-04-07 DIAGNOSIS — E119 Type 2 diabetes mellitus without complications: Secondary | ICD-10-CM | POA: Diagnosis not present

## 2022-04-07 DIAGNOSIS — R000 Tachycardia, unspecified: Secondary | ICD-10-CM | POA: Diagnosis not present

## 2022-04-07 DIAGNOSIS — J101 Influenza due to other identified influenza virus with other respiratory manifestations: Secondary | ICD-10-CM | POA: Diagnosis not present

## 2022-04-07 DIAGNOSIS — R509 Fever, unspecified: Secondary | ICD-10-CM | POA: Diagnosis not present

## 2022-04-14 DIAGNOSIS — R051 Acute cough: Secondary | ICD-10-CM | POA: Diagnosis not present

## 2022-04-14 DIAGNOSIS — R0981 Nasal congestion: Secondary | ICD-10-CM | POA: Diagnosis not present

## 2022-04-14 DIAGNOSIS — J069 Acute upper respiratory infection, unspecified: Secondary | ICD-10-CM | POA: Diagnosis not present

## 2022-05-07 ENCOUNTER — Other Ambulatory Visit (HOSPITAL_COMMUNITY): Payer: Self-pay

## 2022-07-27 ENCOUNTER — Other Ambulatory Visit: Payer: Self-pay | Admitting: Internal Medicine

## 2022-08-04 ENCOUNTER — Telehealth: Payer: Self-pay | Admitting: Internal Medicine

## 2022-08-04 NOTE — Telephone Encounter (Signed)
Prescription Request  08/04/2022  LOV: 02/05/2022  What is the name of the medication or equipment?  OZEMPIC, 2 MG/DOSE, 8 MG/3ML SOPN    Have you contacted your pharmacy to request a refill? No   Which pharmacy would you like this sent to?    [facility] [address] [place] - [address] [place] [address] Phone: [phone] Fax: [phone]  [id number] - [place], [place] - [address] AT [facility] [address] [place] [address] Phone: [phone] Fax: [phone]    Patient notified that their request is being sent to the clinical staff for review and that they should receive a response within 2 business days.   Please advise at Mobile [phone] (mobile)

## 2022-08-05 ENCOUNTER — Other Ambulatory Visit: Payer: Self-pay

## 2022-08-05 MED ORDER — OZEMPIC (2 MG/DOSE) 8 MG/3ML ~~LOC~~ SOPN: PEN_INJECTOR | SUBCUTANEOUS | 3 refills | Status: DC

## 2022-08-05 NOTE — Telephone Encounter (Signed)
Refill Sent. 

## 2022-08-07 ENCOUNTER — Ambulatory Visit: Payer: BC Managed Care – PPO | Admitting: Internal Medicine

## 2022-08-26 ENCOUNTER — Encounter: Payer: Self-pay | Admitting: Internal Medicine

## 2022-08-26 ENCOUNTER — Ambulatory Visit: Payer: BC Managed Care – PPO | Admitting: Internal Medicine

## 2022-08-26 VITALS — BP 120/72 | HR 75 | Temp 98.4°F | Ht 70.0 in | Wt 245.0 lb

## 2022-08-26 DIAGNOSIS — E538 Deficiency of other specified B group vitamins: Secondary | ICD-10-CM | POA: Diagnosis not present

## 2022-08-26 DIAGNOSIS — E1165 Type 2 diabetes mellitus with hyperglycemia: Secondary | ICD-10-CM

## 2022-08-26 DIAGNOSIS — M25512 Pain in left shoulder: Secondary | ICD-10-CM | POA: Diagnosis not present

## 2022-08-26 DIAGNOSIS — Z0001 Encounter for general adult medical examination with abnormal findings: Secondary | ICD-10-CM | POA: Diagnosis not present

## 2022-08-26 DIAGNOSIS — Z125 Encounter for screening for malignant neoplasm of prostate: Secondary | ICD-10-CM

## 2022-08-26 DIAGNOSIS — E782 Mixed hyperlipidemia: Secondary | ICD-10-CM

## 2022-08-26 DIAGNOSIS — I10 Essential (primary) hypertension: Secondary | ICD-10-CM | POA: Diagnosis not present

## 2022-08-26 DIAGNOSIS — E559 Vitamin D deficiency, unspecified: Secondary | ICD-10-CM

## 2022-08-26 DIAGNOSIS — Z1211 Encounter for screening for malignant neoplasm of colon: Secondary | ICD-10-CM

## 2022-08-26 DIAGNOSIS — Z7984 Long term (current) use of oral hypoglycemic drugs: Secondary | ICD-10-CM

## 2022-08-26 DIAGNOSIS — J309 Allergic rhinitis, unspecified: Secondary | ICD-10-CM | POA: Diagnosis not present

## 2022-08-26 LAB — LIPID PANEL
Cholesterol: 80 mg/dL (ref 0–200)
HDL: 37 mg/dL — ABNORMAL LOW (ref >39.00)
LDL Cholesterol: 27 mg/dL (ref 0–99)
NonHDL: 42.75
Total CHOL/HDL Ratio: 2
Triglycerides: 77 mg/dL (ref 0.0–149.0)
VLDL: 15.4 mg/dL (ref 0.0–40.0)

## 2022-08-26 LAB — TSH: TSH: 1.11 u[IU]/mL (ref 0.35–5.50)

## 2022-08-26 LAB — BASIC METABOLIC PANEL
BUN: 11 mg/dL (ref 6–23)
CO2: 28 mEq/L (ref 19–32)
Calcium: 9.4 mg/dL (ref 8.4–10.5)
Chloride: 102 mEq/L (ref 96–112)
Creatinine, Ser: 1 mg/dL (ref 0.40–1.50)
GFR: 81.98 mL/min (ref >60.00)
Glucose, Bld: 133 mg/dL — ABNORMAL HIGH (ref 70–99)
Potassium: 4.5 mEq/L (ref 3.5–5.1)
Sodium: 138 mEq/L (ref 135–145)

## 2022-08-26 LAB — HEMOGLOBIN A1C: Hgb A1c MFr Bld: 7.2 % — ABNORMAL HIGH (ref 4.6–6.5)

## 2022-08-26 LAB — MICROALBUMIN / CREATININE URINE RATIO
Creatinine,U: 118 mg/dL
Microalb Creat Ratio: 0.6 mg/g (ref 0.0–30.0)
Microalb, Ur: <0.7 mg/dL (ref 0.0–1.9)

## 2022-08-26 LAB — CBC WITH DIFFERENTIAL/PLATELET
Basophils Absolute: 0 10*3/uL (ref 0.0–0.1)
Basophils Relative: 0.4 % (ref 0.0–3.0)
Eosinophils Absolute: 0.1 10*3/uL (ref 0.0–0.7)
Eosinophils Relative: 1.7 % (ref 0.0–5.0)
HCT: 45.5 % (ref 39.0–52.0)
Hemoglobin: 15.3 g/dL (ref 13.0–17.0)
Lymphocytes Relative: 32.4 % (ref 12.0–46.0)
Lymphs Abs: 2.6 10*3/uL (ref 0.7–4.0)
MCHC: 33.5 g/dL (ref 30.0–36.0)
MCV: 94.9 fl (ref 78.0–100.0)
Monocytes Absolute: 0.6 10*3/uL (ref 0.1–1.0)
Monocytes Relative: 7.4 % (ref 3.0–12.0)
Neutro Abs: 4.7 10*3/uL (ref 1.4–7.7)
Neutrophils Relative %: 58.1 % (ref 43.0–77.0)
Platelets: 233 10*3/uL (ref 150.0–400.0)
RBC: 4.79 Mil/uL (ref 4.22–5.81)
RDW: 12.9 % (ref 11.5–15.5)
WBC: 8 10*3/uL (ref 4.0–10.5)

## 2022-08-26 LAB — URINALYSIS, ROUTINE W REFLEX MICROSCOPIC
Bilirubin Urine: NEGATIVE
Hgb urine dipstick: NEGATIVE
Ketones, ur: NEGATIVE
Leukocytes,Ua: NEGATIVE
Nitrite: NEGATIVE
Specific Gravity, Urine: 1.025 (ref 1.000–1.030)
Total Protein, Urine: NEGATIVE
Urine Glucose: NEGATIVE
Urobilinogen, UA: 0.2 (ref 0.0–1.0)
pH: 5.5 (ref 5.0–8.0)

## 2022-08-26 LAB — HEPATIC FUNCTION PANEL
ALT: 46 U/L (ref 0–53)
AST: 24 U/L (ref 0–37)
Albumin: 4.1 g/dL (ref 3.5–5.2)
Alkaline Phosphatase: 60 U/L (ref 39–117)
Bilirubin, Direct: 0.2 mg/dL (ref 0.0–0.3)
Total Bilirubin: 0.7 mg/dL (ref 0.2–1.2)
Total Protein: 6.7 g/dL (ref 6.0–8.3)

## 2022-08-26 LAB — VITAMIN D 25 HYDROXY (VIT D DEFICIENCY, FRACTURES): VITD: 38.49 ng/mL (ref 30.00–100.00)

## 2022-08-26 LAB — VITAMIN B12: Vitamin B-12: 1436 pg/mL — ABNORMAL HIGH (ref 211–911)

## 2022-08-26 LAB — PSA: PSA: 0.81 ng/mL (ref 0.10–4.00)

## 2022-08-26 NOTE — Patient Instructions (Addendum)
Please have your Shingrix (shingles) shots done at your local pharmacy.  You will be contacted regarding the referral for: cologuard  Please continue all other medications as before, and refills have been done if requested.  Please have the pharmacy call with any other refills you may need.  Please continue your efforts at being more active, low cholesterol diet, and weight control.  You are otherwise up to date with prevention measures today.  Please keep your appointments with your specialists as you may have planned  Please go to the LAB at the blood drawing area for the tests to be done  You will be contacted by phone if any changes need to be made immediately.  Otherwise, you will receive a letter about your results with an explanation, but please check with MyChart first.  Please remember to sign up for MyChart if you have not done so, as this will be important to you in the future with finding out test results, communicating by private email, and scheduling acute appointments online when needed.  Please make an Appointment to return in 6 months, or sooner if needed

## 2022-08-26 NOTE — Assessment & Plan Note (Signed)
Age and sex appropriate education and counseling updated with regular exercise and diet Referrals for preventative services - for cologuard Immunizations addressed - for shingrix at pharmacy Smoking counseling  - none needed Evidence for depression or other mood disorder - none significant Most recent labs reviewed. I have personally reviewed and have noted: 1) the patient's medical and social history 2) The patient's current medications and supplements 3) The patient's height, weight, and BMI have been recorded in the chart

## 2022-08-26 NOTE — Progress Notes (Signed)
Patient ID: DAIVION MANIVONG, male   DOB: 1961-01-03, 62 y.o.   MRN: [id number]         Chief Complaint:: wellness exam and Medical Management of Chronic Issues (6 month follow up , been having a lot of nasal drainage and left shoulder pain )  , dm, hld, low vit d       HPI:  JASHAD GANUS is a 62 y.o. male here for wellness exam; for shingrix at pharmacy, for cologuard, o/w up to date                        Also Pt denies chest pain, increased sob or doe, wheezing, orthopnea, PND, increased LE swelling, palpitations, dizziness or syncope.   Pt denies polydipsia, polyuria, or new focal neuro s/s.    Pt denies fever, wt loss, night sweats, loss of appetite, or other constitutional symptoms  Does have several wks ongoing nasal allergy symptoms with clearish congestion, itch and sneezing, without fever, pain, ST, cough, swelling or wheezing.  Also has c/o 2-3 wks osnset mild intermittent left shoulder pain, worse to abduct, better to rest, nothing else makes better or worse, no neck or radicular pain.     Wt Readings from Last 3 Encounters:  08/26/22 245 lb (111.1 kg)  02/05/22 253 lb (114.8 kg)  09/16/21 249 lb (112.9 kg)   BP Readings from Last 3 Encounters:  08/26/22 120/72  02/05/22 128/80  09/16/21 132/72   Immunization History  Administered Date(s) Administered   Influenza,inj,Quad PF,6+ Mos 12/25/2017   Health Maintenance Due  Topic Date Due   Zoster Vaccines- Shingrix (1 of 2) Never done   Fecal DNA (Cologuard)  10/03/2018      Past Medical History:  Diagnosis Date   Hypercholesteremia    Hyperglycemia 07/31/2015   Hyperlipidemia 07/31/2015   Past Surgical History:  Procedure Laterality Date   BILATERAL CARPAL TUNNEL RELEASE     CARPAL TUNNEL WITH CUBITAL TUNNEL      reports that he quit smoking about 37 years ago. His smoking use included cigarettes. He has never used smokeless tobacco. He reports current alcohol use. He reports that he does not use drugs. family  history includes Healthy in his brother, daughter, and son; Heart disease in his father; Kidney disease in his father; Parkinson's disease in his mother; Stroke in his father. Allergies  Allergen Reactions   Tetanus Toxoid     REACTION: arm swelling   Penicillins    Current Outpatient Medications on File Prior to Visit  Medication Sig Dispense Refill   Ascorbic Acid (VITAMIN C) 1000 MG tablet Take 1,000 mg by mouth daily.     aspirin EC 81 MG tablet Take 1 tablet (81 mg total) by mouth daily. 90 tablet 11   B Complex-C (SUPER B COMPLEX PO) Take 1 tablet by mouth daily.     Chromium 1000 MCG TABS Take 1 tablet by mouth daily.     CINNAMON PO Take 1,000 mg by mouth daily.     clotrimazole-betamethasone (LOTRISONE) cream Apply 1 Application topically daily. 30 g 0   ELDERBERRY PO Take 1 tablet by mouth daily.     glucose blood (COOL BLOOD GLUCOSE TEST STRIPS) test strip Use as instructed to check blood sugar daily. DX: E11.9 100 each 12   metFORMIN (GLUCOPHAGE-XR) 500 MG 24 hr tablet TAKE 2 TABLETS BY MOUTH ONCE DAILY WITH BREAKFAST 180 tablet 3   NON FORMULARY Take 1 tablet by  mouth daily. [facility]     pyridoxine (B-6) 100 MG tablet Take 100 mg by mouth daily.     rosuvastatin (CRESTOR) 20 MG tablet Take 1 tablet (20 mg total) by mouth daily. 90 tablet 3   Semaglutide, 2 MG/DOSE, (OZEMPIC, 2 MG/DOSE,) 8 MG/3ML SOPN INJECT 1 DOSE ONCE A WEEK 9 mL 3   TURMERIC CURCUMIN PO Take 1,500 mg by mouth daily.     vitamin B-12 (CYANOCOBALAMIN) 500 MCG tablet Take 500 mcg by mouth daily.     No current facility-administered medications on file prior to visit.        ROS:  All others reviewed and negative.  Objective        PE:  BP 120/72 (BP Location: Right Arm, Patient Position: Sitting, Cuff Size: Normal)   Pulse 75   Temp 98.4 F (36.9 C) (Oral)   Ht 5\' 10"  (1.778 m)   Wt 245 lb (111.1 kg)   SpO2 98%   BMI 35.15 kg/m                 Constitutional: Pt appears in NAD                HENT: Head: NCAT.                Right Ear: External ear normal.                 Left Ear: External ear normal. Bilat tm's with mild erythema.  Max sinus areas non tender.  Pharynx with mild erythema, no exudate                Eyes: . Pupils are equal, round, and reactive to light. Conjunctivae and EOM are normal               Nose: without d/c or deformity               Neck: Neck supple. Gross normal ROM               Cardiovascular: Normal rate and regular rhythm.                 Pulmonary/Chest: Effort normal and breath sounds without rales or wheezing. ; left shoudler NT but pain on abduction               Abd:  Soft, NT, ND, + BS, no organomegaly               Neurological: Pt is alert. At baseline orientation, motor grossly intact               Skin: Skin is warm. No rashes, no other new lesions, LE edema - npne               Psychiatric: Pt behavior is normal without agitation   Micro: none  Cardiac tracings I have personally interpreted today:  none  Pertinent Radiological findings (summarize): none   Lab Results  Component Value Date   WBC 8.0 08/26/2022   HGB 15.3 08/26/2022   HCT 45.5 08/26/2022   PLT 233.0 08/26/2022   GLUCOSE 133 (H) 08/26/2022   CHOL 80 08/26/2022   TRIG 77.0 08/26/2022   HDL 37.00 (L) 08/26/2022   LDLDIRECT 46.0 07/31/2020   LDLCALC 27 08/26/2022   ALT 46 08/26/2022   AST 24 08/26/2022   NA 138 08/26/2022   K 4.5 08/26/2022   CL 102 08/26/2022   CREATININE 1.00 08/26/2022  BUN 11 08/26/2022   CO2 28 08/26/2022   TSH 1.11 08/26/2022   PSA 0.81 08/26/2022   HGBA1C 7.2 (H) 08/26/2022   MICROALBUR <0.7 08/26/2022   Assessment/Plan:  JEMIL STGERMAIN is a 62 y.o. White or Caucasian [1] male with  has a past medical history of Hypercholesteremia, Hyperglycemia (07/31/2015), and Hyperlipidemia (07/31/2015).  Encounter for well adult exam with abnormal findings Age and sex appropriate education and counseling updated with regular exercise  and diet Referrals for preventative services - for cologuard Immunizations addressed - for shingrix at pharmacy Smoking counseling  - none needed Evidence for depression or other mood disorder - none significant Most recent labs reviewed. I have personally reviewed and have noted: 1) the patient's medical and social history 2) The patient's current medications and supplements 3) The patient's height, weight, and BMI have been recorded in the chart   Essential hypertension BP Readings from Last 3 Encounters:  08/26/22 120/72  02/05/22 128/80  09/16/21 132/72   Stable, pt to continue medical treatment  - diet, wt control   Allergic rhinitis Mild to mod, for otc allegra and nasacort asd, to f/u any worsening symptoms or concerns  Diabetes (HCC) Lab Results  Component Value Date   HGBA1C 7.2 (H) 08/26/2022   Mild uncontrolled, , pt for better DM diet, continue metfomrin ER 500 mg - 2 tab every day, ozempic 2 mg weekly as declines other change for now   Hyperlipidemia Lab Results  Component Value Date   LDLCALC 27 08/26/2022   Stable, pt to continue current statin crestor 20 mg qd   Vitamin D deficiency Last vitamin D Lab Results  Component Value Date   VD25OH 38.49 08/26/2022   Low, to start oral replacement   Left shoulder pain C/w likely rot cuff disorder, encouraged for f/u with sports medicine on the first floow  Followup: Return in about 6 months (around 02/25/2023).  [name], MD 08/30/2022 2:39 PM [facility] - [facility]

## 2022-08-26 NOTE — Progress Notes (Signed)
The test results show that your current treatment is OK, as the tests are stable.  Please continue the same plan.  There is no other need for change of treatment or further evaluation based on these results, at this time.  thanks 

## 2022-08-28 ENCOUNTER — Telehealth: Payer: Self-pay | Admitting: Internal Medicine

## 2022-08-28 NOTE — Telephone Encounter (Signed)
Cancel cologuard that was ordered yesterday.Marland KitchenRaechel Chute

## 2022-08-28 NOTE — Telephone Encounter (Signed)
Patient called and said to please cancel his cologuard request - insurance will not pay until after 10/2023

## 2022-08-30 ENCOUNTER — Encounter: Payer: Self-pay | Admitting: Internal Medicine

## 2022-08-30 DIAGNOSIS — M25512 Pain in left shoulder: Secondary | ICD-10-CM | POA: Insufficient documentation

## 2022-08-30 NOTE — Assessment & Plan Note (Signed)
Mild to mod, for otc allegra and nasacort asd,  to f/u any worsening symptoms or concerns 

## 2022-08-30 NOTE — Assessment & Plan Note (Signed)
C/w likely rot cuff disorder, encouraged for f/u with sports medicine on the first floow

## 2022-08-30 NOTE — Assessment & Plan Note (Signed)
Lab Results  Component Value Date   LDLCALC 27 08/26/2022   Stable, pt to continue current statin crestor 20 mg qd

## 2022-08-30 NOTE — Assessment & Plan Note (Signed)
Last vitamin D Lab Results  Component Value Date   VD25OH 38.49 08/26/2022   Low, to start oral replacement

## 2022-08-30 NOTE — Assessment & Plan Note (Signed)
BP Readings from Last 3 Encounters:  08/26/22 120/72  02/05/22 128/80  09/16/21 132/72   Stable, pt to continue medical treatment  - diet, wt control

## 2022-08-30 NOTE — Assessment & Plan Note (Signed)
Lab Results  Component Value Date   HGBA1C 7.2 (H) 08/26/2022   Mild uncontrolled, , pt for better DM diet, continue metfomrin ER 500 mg - 2 tab every day, ozempic 2 mg weekly as declines other change for now

## 2022-10-24 ENCOUNTER — Other Ambulatory Visit: Payer: Self-pay

## 2022-10-24 ENCOUNTER — Other Ambulatory Visit: Payer: Self-pay | Admitting: Internal Medicine

## 2022-11-06 ENCOUNTER — Other Ambulatory Visit: Payer: Self-pay | Admitting: Internal Medicine

## 2022-11-06 ENCOUNTER — Other Ambulatory Visit: Payer: Self-pay

## 2022-12-15 DIAGNOSIS — N179 Acute kidney failure, unspecified: Secondary | ICD-10-CM | POA: Diagnosis not present

## 2022-12-15 DIAGNOSIS — Z87442 Personal history of urinary calculi: Secondary | ICD-10-CM | POA: Diagnosis not present

## 2022-12-15 DIAGNOSIS — E860 Dehydration: Secondary | ICD-10-CM | POA: Diagnosis not present

## 2022-12-15 DIAGNOSIS — Z79899 Other long term (current) drug therapy: Secondary | ICD-10-CM | POA: Diagnosis not present

## 2022-12-15 DIAGNOSIS — Z7984 Long term (current) use of oral hypoglycemic drugs: Secondary | ICD-10-CM | POA: Diagnosis not present

## 2022-12-15 DIAGNOSIS — Z880 Allergy status to penicillin: Secondary | ICD-10-CM | POA: Diagnosis not present

## 2022-12-15 DIAGNOSIS — K560 Paralytic ileus: Secondary | ICD-10-CM | POA: Diagnosis not present

## 2022-12-15 DIAGNOSIS — R1084 Generalized abdominal pain: Secondary | ICD-10-CM | POA: Diagnosis not present

## 2022-12-15 DIAGNOSIS — K567 Ileus, unspecified: Secondary | ICD-10-CM | POA: Diagnosis not present

## 2022-12-15 DIAGNOSIS — N200 Calculus of kidney: Secondary | ICD-10-CM | POA: Diagnosis not present

## 2022-12-15 DIAGNOSIS — R140 Abdominal distension (gaseous): Secondary | ICD-10-CM | POA: Diagnosis not present

## 2022-12-15 DIAGNOSIS — K56609 Unspecified intestinal obstruction, unspecified as to partial versus complete obstruction: Secondary | ICD-10-CM | POA: Diagnosis not present

## 2022-12-15 DIAGNOSIS — K6389 Other specified diseases of intestine: Secondary | ICD-10-CM | POA: Diagnosis not present

## 2022-12-15 DIAGNOSIS — I472 Ventricular tachycardia, unspecified: Secondary | ICD-10-CM | POA: Diagnosis not present

## 2022-12-15 DIAGNOSIS — Z7982 Long term (current) use of aspirin: Secondary | ICD-10-CM | POA: Diagnosis not present

## 2022-12-15 DIAGNOSIS — Z7985 Long-term (current) use of injectable non-insulin antidiabetic drugs: Secondary | ICD-10-CM | POA: Diagnosis not present

## 2022-12-15 DIAGNOSIS — N132 Hydronephrosis with renal and ureteral calculous obstruction: Secondary | ICD-10-CM | POA: Diagnosis not present

## 2022-12-15 DIAGNOSIS — N201 Calculus of ureter: Secondary | ICD-10-CM | POA: Diagnosis not present

## 2022-12-15 DIAGNOSIS — R109 Unspecified abdominal pain: Secondary | ICD-10-CM | POA: Diagnosis not present

## 2022-12-15 DIAGNOSIS — E119 Type 2 diabetes mellitus without complications: Secondary | ICD-10-CM | POA: Diagnosis not present

## 2022-12-15 DIAGNOSIS — I878 Other specified disorders of veins: Secondary | ICD-10-CM | POA: Diagnosis not present

## 2022-12-15 DIAGNOSIS — K5981 Ogilvie syndrome: Secondary | ICD-10-CM | POA: Diagnosis not present

## 2022-12-24 DIAGNOSIS — M79602 Pain in left arm: Secondary | ICD-10-CM | POA: Diagnosis not present

## 2022-12-24 DIAGNOSIS — R066 Hiccough: Secondary | ICD-10-CM | POA: Diagnosis not present

## 2022-12-24 DIAGNOSIS — R600 Localized edema: Secondary | ICD-10-CM | POA: Diagnosis not present

## 2022-12-24 DIAGNOSIS — I809 Phlebitis and thrombophlebitis of unspecified site: Secondary | ICD-10-CM | POA: Diagnosis not present

## 2022-12-24 DIAGNOSIS — I808 Phlebitis and thrombophlebitis of other sites: Secondary | ICD-10-CM | POA: Diagnosis not present

## 2022-12-24 DIAGNOSIS — I82612 Acute embolism and thrombosis of superficial veins of left upper extremity: Secondary | ICD-10-CM | POA: Diagnosis not present

## 2022-12-25 ENCOUNTER — Telehealth: Payer: Self-pay

## 2022-12-25 NOTE — Transitions of Care (Post Inpatient/ED Visit) (Signed)
12/25/2022  Name: Brendan Washington MRN: [id number] DOB: 04-21-1960  Today's TOC FU Call Status: Today's TOC FU Call Status:: Successful TOC FU Call Completed TOC FU Call Complete Date: 12/25/22 Patient's Name and Date of Birth confirmed.  Transition Care Management Follow-up Telephone Call Date of Discharge: 12/24/22 Discharge Facility: Other [facility]) Name of Other (Non-[facility]) Discharge Facility: [place] Type of Discharge: Inpatient Admission Primary Inpatient Discharge Diagnosis:: kidney stone How have you been since you were released from the hospital?: Better Any questions or concerns?: No  Items Reviewed: Did you receive and understand the discharge instructions provided?: Yes Medications obtained,verified, and reconciled?: Yes (Medications Reviewed) Any new allergies since your discharge?: No Dietary orders reviewed?: Yes Do you have support at home?: Yes People in Home: spouse  Medications Reviewed Today: Medications Reviewed Today     Reviewed by Karena Addison, LPN (Licensed Practical Nurse) on 12/25/22 at 1420  Med List Status: <None>   Medication Order Taking? Sig Documenting Provider Last Dose Status Informant  Ascorbic Acid (VITAMIN C) 1000 MG tablet [id number] Yes Take 1,000 mg by mouth daily. [provider] Taking Active   aspirin EC 81 MG tablet [id number] Yes Take 1 tablet (81 mg total) by mouth daily. Corwin Levins, MD Taking Active   B Complex-C (SUPER B COMPLEX PO) [id number] Yes Take 1 tablet by mouth daily. [provider] Taking Active   Chromium 1000 MCG TABS [id number] Yes Take 1 tablet by mouth daily. [provider] Taking Active   CINNAMON PO [id number] Yes Take 1,000 mg by mouth daily. [provider] Taking Active   clotrimazole-betamethasone Thurmond Butts) cream [id number] Yes APPLY  CREAM TOPICALLY TO AFFECTED AREA ONCE DAILY Corwin Levins, MD Taking Active   ELDERBERRY PO [id number] Yes Take 1 tablet by  mouth daily. [provider] Taking Active   glucose blood (COOL BLOOD GLUCOSE TEST STRIPS) test strip [id number] Yes Use as instructed to check blood sugar daily. DX: E11.9 Corwin Levins, MD Taking Active   metFORMIN (GLUCOPHAGE-XR) 500 MG 24 hr tablet [id number] Yes TAKE 2 TABLETS BY MOUTH ONCE DAILY WITH BREAKFAST Corwin Levins, MD Taking Active   NON FORMULARY [id number] Yes Take 1 tablet by mouth daily. [facility] [provider] Taking Active   pyridoxine (B-6) 100 MG tablet [id number] Yes Take 100 mg by mouth daily. [provider] Taking Active   rosuvastatin (CRESTOR) 20 MG tablet [id number] Yes Take 1 tablet by mouth once daily Corwin Levins, MD Taking Active   Semaglutide, 2 MG/DOSE, (OZEMPIC, 2 MG/DOSE,) 8 MG/3ML SOPN [id number] No INJECT 1 DOSE ONCE A WEEK  Patient not taking: Reported on 12/25/2022   Corwin Levins, MD Not Taking Active   TURMERIC CURCUMIN PO [id number] Yes Take 1,500 mg by mouth daily. [provider] Taking Active   vitamin B-12 (CYANOCOBALAMIN) 500 MCG tablet [id number] Yes Take 500 mcg by mouth daily. [provider] Taking Active             Home Care and Equipment/Supplies: Were Home Health Services Ordered?: NA Any new equipment or medical supplies ordered?: NA  Functional Questionnaire: Do you need assistance with bathing/showering or dressing?: No Do you need assistance with meal preparation?: No Do you need assistance with eating?: No Do you have difficulty maintaining continence: No Do you need assistance with getting out of bed/getting out of a chair/moving?: No Do you have difficulty managing or taking your medications?: No  Follow up appointments reviewed: PCP  Follow-up appointment confirmed?: Yes Date of PCP follow-up appointment?: 12/29/22 Follow-up Provider: [facility] Follow-up appointment confirmed?: NA Do you need transportation to your follow-up appointment?: No Do you  understand care options if your condition(s) worsen?: Yes-patient verbalized understanding    SIGNATURE Karena Addison, LPN [facility] Nurse Health Advisor Direct Dial [phone]

## 2022-12-29 ENCOUNTER — Encounter: Payer: Self-pay | Admitting: Internal Medicine

## 2022-12-29 ENCOUNTER — Ambulatory Visit: Payer: BC Managed Care – PPO | Admitting: Internal Medicine

## 2022-12-29 VITALS — BP 134/72 | HR 95 | Temp 99.0°F | Ht 70.0 in | Wt 238.0 lb

## 2022-12-29 DIAGNOSIS — Z8719 Personal history of other diseases of the digestive system: Secondary | ICD-10-CM | POA: Diagnosis not present

## 2022-12-29 DIAGNOSIS — E860 Dehydration: Secondary | ICD-10-CM | POA: Insufficient documentation

## 2022-12-29 DIAGNOSIS — N132 Hydronephrosis with renal and ureteral calculous obstruction: Secondary | ICD-10-CM | POA: Diagnosis not present

## 2022-12-29 DIAGNOSIS — N209 Urinary calculus, unspecified: Secondary | ICD-10-CM | POA: Insufficient documentation

## 2022-12-29 DIAGNOSIS — Z7984 Long term (current) use of oral hypoglycemic drugs: Secondary | ICD-10-CM

## 2022-12-29 DIAGNOSIS — I809 Phlebitis and thrombophlebitis of unspecified site: Secondary | ICD-10-CM

## 2022-12-29 DIAGNOSIS — K5981 Ogilvie syndrome: Secondary | ICD-10-CM | POA: Insufficient documentation

## 2022-12-29 DIAGNOSIS — E1165 Type 2 diabetes mellitus with hyperglycemia: Secondary | ICD-10-CM

## 2022-12-29 DIAGNOSIS — Z87442 Personal history of urinary calculi: Secondary | ICD-10-CM | POA: Diagnosis not present

## 2022-12-29 DIAGNOSIS — N201 Calculus of ureter: Secondary | ICD-10-CM | POA: Insufficient documentation

## 2022-12-29 DIAGNOSIS — I4729 Other ventricular tachycardia: Secondary | ICD-10-CM | POA: Insufficient documentation

## 2022-12-29 DIAGNOSIS — N179 Acute kidney failure, unspecified: Secondary | ICD-10-CM | POA: Insufficient documentation

## 2022-12-29 DIAGNOSIS — R1031 Right lower quadrant pain: Secondary | ICD-10-CM | POA: Insufficient documentation

## 2022-12-29 DIAGNOSIS — N135 Crossing vessel and stricture of ureter without hydronephrosis: Secondary | ICD-10-CM | POA: Insufficient documentation

## 2022-12-29 DIAGNOSIS — K560 Paralytic ileus: Secondary | ICD-10-CM | POA: Insufficient documentation

## 2022-12-29 NOTE — Progress Notes (Unsigned)
Patient ID: Brendan Washington, male   DOB: 04-23-1960, 62 y.o.   MRN: [id number]        Chief Complaint: follow up recent hospn oct 14 - 22 at [facility] hosp (no records here on EMR) - pt to sign release of information form       HPI:  Brendan Washington is a 62 y.o. male here after lengthy hospn with 6 mm right renal stone and ileus, requiring eventual lithotripsy, right ureteral stent placement and prolonged NG tube for ileus, eventually spontaneously resolved.  Ileus though possibly related to ozempic and stopped.  Finally able for d/c, but then had to see UC on oct 23 due to left antecubital cellulitis and phlebitis at IV site, tx with cleocin.  Now improved pain, redness, swelling and tolerating cleocin well, Denies worsening reflux, abd pain, dysphagia, n/v, bowel change or blood.  Pt denies chest pain, increased sob or doe, wheezing, orthopnea, PND, increased LE swelling, palpitations, dizziness or syncope.   Pt denies fever, night sweats, loss of appetite, or other constitutional symptoms, though lost significant wt with ozempic then ileus illness. Has f/u appt with urology 1 wk, will need stent removed.  Also had hiccups prior to hosp d/c tx with thorazine now resolved.         Wt Readings from Last 3 Encounters:  12/29/22 238 lb (108 kg)  08/26/22 245 lb (111.1 kg)  02/05/22 253 lb (114.8 kg)   BP Readings from Last 3 Encounters:  12/29/22 134/72  08/26/22 120/72  02/05/22 128/80         Past Medical History:  Diagnosis Date   Hypercholesteremia    Hyperglycemia 07/31/2015   Hyperlipidemia 07/31/2015   Past Surgical History:  Procedure Laterality Date   BILATERAL CARPAL TUNNEL RELEASE     CARPAL TUNNEL WITH CUBITAL TUNNEL      reports that he quit smoking about 37 years ago. His smoking use included cigarettes. He has never used smokeless tobacco. He reports current alcohol use. He reports that he does not use drugs. family history includes Healthy in his brother, daughter, and son;  Heart disease in his father; Kidney disease in his father; Parkinson's disease in his mother; Stroke in his father. Allergies  Allergen Reactions   Tetanus Toxoid     REACTION: arm swelling   Penicillins    Current Outpatient Medications on File Prior to Visit  Medication Sig Dispense Refill   Ascorbic Acid (VITAMIN C) 1000 MG tablet Take 1,000 mg by mouth daily.     aspirin EC 81 MG tablet Take 1 tablet (81 mg total) by mouth daily. 90 tablet 11   B Complex-C (SUPER B COMPLEX PO) Take 1 tablet by mouth daily.     Chromium 1000 MCG TABS Take 1 tablet by mouth daily.     CINNAMON PO Take 1,000 mg by mouth daily.     clotrimazole-betamethasone (LOTRISONE) cream APPLY  CREAM TOPICALLY TO AFFECTED AREA ONCE DAILY 30 g 0   ELDERBERRY PO Take 1 tablet by mouth daily.     glucose blood (COOL BLOOD GLUCOSE TEST STRIPS) test strip Use as instructed to check blood sugar daily. DX: E11.9 100 each 12   metFORMIN (GLUCOPHAGE-XR) 500 MG 24 hr tablet TAKE 2 TABLETS BY MOUTH ONCE DAILY WITH BREAKFAST 180 tablet 3   NON FORMULARY Take 1 tablet by mouth daily. [facility]     pyridoxine (B-6) 100 MG tablet Take 100 mg by mouth daily.  rosuvastatin (CRESTOR) 20 MG tablet Take 1 tablet by mouth once daily 90 tablet 3   TURMERIC CURCUMIN PO Take 1,500 mg by mouth daily.     vitamin B-12 (CYANOCOBALAMIN) 500 MCG tablet Take 500 mcg by mouth daily.     No current facility-administered medications on file prior to visit.        ROS:  All others reviewed and negative.  Objective        PE:  BP 134/72 (BP Location: Right Arm, Patient Position: Sitting, Cuff Size: Normal)   Pulse 95   Temp 99 F (37.2 C) (Oral)   Ht 5\' 10"  (1.778 m)   Wt 238 lb (108 kg)   SpO2 97%   BMI 34.15 kg/m                 Constitutional: Pt appears in NAD               HENT: Head: NCAT.                Right Ear: External ear normal.                 Left Ear: External ear normal.                Eyes: . Pupils are  equal, round, and reactive to light. Conjunctivae and EOM are normal               Nose: without d/c or deformity               Neck: Neck supple. Gross normal ROM               Cardiovascular: Normal rate and regular rhythm.                 Pulmonary/Chest: Effort normal and breath sounds without rales or wheezing.                Abd:  Soft, NT, ND, + BS, no organomegaly               Neurological: Pt is alert. At baseline orientation, motor grossly intact               Skin: Skin is warm. No rashes, no other new lesions, LE edema - none               Psychiatric: Pt behavior is normal without agitation   Micro: none  Cardiac tracings I have personally interpreted today:  none  Pertinent Radiological findings (summarize): none   Lab Results  Component Value Date   WBC 8.0 08/26/2022   HGB 15.3 08/26/2022   HCT 45.5 08/26/2022   PLT 233.0 08/26/2022   GLUCOSE 133 (H) 08/26/2022   CHOL 80 08/26/2022   TRIG 77.0 08/26/2022   HDL 37.00 (L) 08/26/2022   LDLDIRECT 46.0 07/31/2020   LDLCALC 27 08/26/2022   ALT 46 08/26/2022   AST 24 08/26/2022   NA 138 08/26/2022   K 4.5 08/26/2022   CL 102 08/26/2022   CREATININE 1.00 08/26/2022   BUN 11 08/26/2022   CO2 28 08/26/2022   TSH 1.11 08/26/2022   PSA 0.81 08/26/2022   HGBA1C 7.2 (H) 08/26/2022   MICROALBUR <0.7 08/26/2022   Assessment/Plan:  Brendan Washington is a 62 y.o. White or Caucasian [1] male with  has a past medical history of Hypercholesteremia, Hyperglycemia (07/31/2015), and Hyperlipidemia (07/31/2015).  Right ureteral calculus Resolved s/p lithotrypsy  Hydronephrosis with  renal and ureteral calculous obstruction Now with right ureteral stent, for f/u urology 1 wk for removal  Paralytic ileus of small intestine and colon (HCC) Clinically resolved,  to f/u any worsening symptoms or concerns  Diabetes (HCC) Lab Results  Component Value Date   HGBA1C 7.2 (H) 08/26/2022   Stable, pt to continue current medical  treatment metformin ER 500 mg - 2 qam   Superficial phlebitis Resolving, pt enocuraged to continue cool compress, finish cleocin for ? cellulitis Followup: Return in about 3 months (around 03/31/2023).  Brendan Barre, MD 12/30/2022 8:53 PM [facility] - [facility]

## 2022-12-29 NOTE — Patient Instructions (Signed)
Ok to stay off the ozempic for now  Please continue all other medications as before, and refills have been done if requested.  Please have the pharmacy call with any other refills you may need.  Please continue your efforts at being more active, low cholesterol diet, and weight control.  Please keep your appointments with your specialists as you may have planned  Please make an Appointment to return in 3 months, or sooner if needed

## 2022-12-30 ENCOUNTER — Encounter: Payer: Self-pay | Admitting: Internal Medicine

## 2022-12-30 DIAGNOSIS — I809 Phlebitis and thrombophlebitis of unspecified site: Secondary | ICD-10-CM | POA: Insufficient documentation

## 2022-12-30 NOTE — Assessment & Plan Note (Signed)
Resolving, pt enocuraged to continue cool compress, finish cleocin for ? cellulitis

## 2022-12-30 NOTE — Assessment & Plan Note (Signed)
Resolved s/p lithotrypsy

## 2022-12-30 NOTE — Assessment & Plan Note (Signed)
Now with right ureteral stent, for f/u urology 1 wk for removal

## 2022-12-30 NOTE — Assessment & Plan Note (Signed)
Lab Results  Component Value Date   HGBA1C 7.2 (H) 08/26/2022   Stable, pt to continue current medical treatment metformin ER 500 mg - 2 qam

## 2022-12-30 NOTE — Assessment & Plan Note (Signed)
Clinically resolved,  to f/u any worsening symptoms or concerns 

## 2023-01-12 DIAGNOSIS — N200 Calculus of kidney: Secondary | ICD-10-CM | POA: Diagnosis not present

## 2023-01-15 DIAGNOSIS — R1031 Right lower quadrant pain: Secondary | ICD-10-CM | POA: Diagnosis not present

## 2023-01-15 DIAGNOSIS — N200 Calculus of kidney: Secondary | ICD-10-CM | POA: Diagnosis not present

## 2023-01-23 DIAGNOSIS — Z79899 Other long term (current) drug therapy: Secondary | ICD-10-CM | POA: Diagnosis not present

## 2023-01-23 DIAGNOSIS — N200 Calculus of kidney: Secondary | ICD-10-CM | POA: Diagnosis not present

## 2023-01-23 DIAGNOSIS — Z7984 Long term (current) use of oral hypoglycemic drugs: Secondary | ICD-10-CM | POA: Diagnosis not present

## 2023-01-23 DIAGNOSIS — Z7982 Long term (current) use of aspirin: Secondary | ICD-10-CM | POA: Diagnosis not present

## 2023-01-23 DIAGNOSIS — Z87891 Personal history of nicotine dependence: Secondary | ICD-10-CM | POA: Diagnosis not present

## 2023-01-23 DIAGNOSIS — Z792 Long term (current) use of antibiotics: Secondary | ICD-10-CM | POA: Diagnosis not present

## 2023-01-23 DIAGNOSIS — Z87442 Personal history of urinary calculi: Secondary | ICD-10-CM | POA: Diagnosis not present

## 2023-01-23 DIAGNOSIS — E119 Type 2 diabetes mellitus without complications: Secondary | ICD-10-CM | POA: Diagnosis not present

## 2023-02-02 DIAGNOSIS — N200 Calculus of kidney: Secondary | ICD-10-CM | POA: Diagnosis not present

## 2023-02-03 DIAGNOSIS — R1031 Right lower quadrant pain: Secondary | ICD-10-CM | POA: Diagnosis not present

## 2023-02-03 DIAGNOSIS — N200 Calculus of kidney: Secondary | ICD-10-CM | POA: Diagnosis not present

## 2023-02-18 ENCOUNTER — Ambulatory Visit: Payer: BC Managed Care – PPO | Admitting: Internal Medicine

## 2023-03-01 DIAGNOSIS — N200 Calculus of kidney: Secondary | ICD-10-CM | POA: Diagnosis not present

## 2023-03-18 DIAGNOSIS — N200 Calculus of kidney: Secondary | ICD-10-CM | POA: Diagnosis not present

## 2023-03-18 DIAGNOSIS — R1031 Right lower quadrant pain: Secondary | ICD-10-CM | POA: Diagnosis not present

## 2023-03-31 ENCOUNTER — Ambulatory Visit: Payer: BC Managed Care – PPO | Admitting: Internal Medicine

## 2023-03-31 ENCOUNTER — Encounter: Payer: Self-pay | Admitting: Internal Medicine

## 2023-03-31 ENCOUNTER — Other Ambulatory Visit: Payer: Self-pay | Admitting: Internal Medicine

## 2023-03-31 VITALS — BP 130/78 | HR 68 | Temp 98.0°F | Ht 70.0 in | Wt 247.0 lb

## 2023-03-31 DIAGNOSIS — E559 Vitamin D deficiency, unspecified: Secondary | ICD-10-CM | POA: Diagnosis not present

## 2023-03-31 DIAGNOSIS — E782 Mixed hyperlipidemia: Secondary | ICD-10-CM

## 2023-03-31 DIAGNOSIS — E1165 Type 2 diabetes mellitus with hyperglycemia: Secondary | ICD-10-CM

## 2023-03-31 DIAGNOSIS — Z0001 Encounter for general adult medical examination with abnormal findings: Secondary | ICD-10-CM | POA: Diagnosis not present

## 2023-03-31 DIAGNOSIS — E538 Deficiency of other specified B group vitamins: Secondary | ICD-10-CM

## 2023-03-31 DIAGNOSIS — I10 Essential (primary) hypertension: Secondary | ICD-10-CM

## 2023-03-31 DIAGNOSIS — Z125 Encounter for screening for malignant neoplasm of prostate: Secondary | ICD-10-CM

## 2023-03-31 DIAGNOSIS — Z7984 Long term (current) use of oral hypoglycemic drugs: Secondary | ICD-10-CM

## 2023-03-31 LAB — URINALYSIS, ROUTINE W REFLEX MICROSCOPIC
Bilirubin Urine: NEGATIVE
Hgb urine dipstick: NEGATIVE
Ketones, ur: NEGATIVE
Leukocytes,Ua: NEGATIVE
Nitrite: NEGATIVE
RBC / HPF: NONE SEEN (ref 0–?)
Specific Gravity, Urine: >=1.03 — AB (ref 1.000–1.030)
Total Protein, Urine: NEGATIVE
Urine Glucose: NEGATIVE
Urobilinogen, UA: 0.2 (ref 0.0–1.0)
pH: 6 (ref 5.0–8.0)

## 2023-03-31 LAB — CBC WITH DIFFERENTIAL/PLATELET
Basophils Absolute: 0 10*3/uL (ref 0.0–0.1)
Basophils Relative: 0.5 % (ref 0.0–3.0)
Eosinophils Absolute: 0.2 10*3/uL (ref 0.0–0.7)
Eosinophils Relative: 3.2 % (ref 0.0–5.0)
HCT: 46 % (ref 39.0–52.0)
Hemoglobin: 15.6 g/dL (ref 13.0–17.0)
Lymphocytes Relative: 35.8 % (ref 12.0–46.0)
Lymphs Abs: 2.2 10*3/uL (ref 0.7–4.0)
MCHC: 34 g/dL (ref 30.0–36.0)
MCV: 95.3 fL (ref 78.0–100.0)
Monocytes Absolute: 0.6 10*3/uL (ref 0.1–1.0)
Monocytes Relative: 10.1 % (ref 3.0–12.0)
Neutro Abs: 3.1 10*3/uL (ref 1.4–7.7)
Neutrophils Relative %: 50.4 % (ref 43.0–77.0)
Platelets: 217 10*3/uL (ref 150.0–400.0)
RBC: 4.83 Mil/uL (ref 4.22–5.81)
RDW: 13.2 % (ref 11.5–15.5)
WBC: 6.1 10*3/uL (ref 4.0–10.5)

## 2023-03-31 LAB — BASIC METABOLIC PANEL
BUN: 13 mg/dL (ref 6–23)
CO2: 27 meq/L (ref 19–32)
Calcium: 9 mg/dL (ref 8.4–10.5)
Chloride: 103 meq/L (ref 96–112)
Creatinine, Ser: 1.07 mg/dL (ref 0.40–1.50)
GFR: 75.27 mL/min (ref >60.00)
Glucose, Bld: 188 mg/dL — ABNORMAL HIGH (ref 70–99)
Potassium: 4.4 meq/L (ref 3.5–5.1)
Sodium: 137 meq/L (ref 135–145)

## 2023-03-31 LAB — LIPID PANEL
Cholesterol: 94 mg/dL (ref 0–200)
HDL: 36.8 mg/dL — ABNORMAL LOW (ref >39.00)
LDL Cholesterol: 37 mg/dL (ref 0–99)
NonHDL: 57.65
Total CHOL/HDL Ratio: 3
Triglycerides: 101 mg/dL (ref 0.0–149.0)
VLDL: 20.2 mg/dL (ref 0.0–40.0)

## 2023-03-31 LAB — VITAMIN D 25 HYDROXY (VIT D DEFICIENCY, FRACTURES): VITD: 38.04 ng/mL (ref 30.00–100.00)

## 2023-03-31 LAB — HEPATIC FUNCTION PANEL
ALT: 41 U/L (ref 0–53)
AST: 27 U/L (ref 0–37)
Albumin: 4.4 g/dL (ref 3.5–5.2)
Alkaline Phosphatase: 69 U/L (ref 39–117)
Bilirubin, Direct: 0.2 mg/dL (ref 0.0–0.3)
Total Bilirubin: 0.6 mg/dL (ref 0.2–1.2)
Total Protein: 6.8 g/dL (ref 6.0–8.3)

## 2023-03-31 LAB — TSH: TSH: 1.12 u[IU]/mL (ref 0.35–5.50)

## 2023-03-31 LAB — MICROALBUMIN / CREATININE URINE RATIO
Creatinine,U: 134.6 mg/dL
Microalb Creat Ratio: 0.6 mg/g (ref 0.0–30.0)
Microalb, Ur: 0.9 mg/dL (ref 0.0–1.9)

## 2023-03-31 LAB — HEMOGLOBIN A1C: Hgb A1c MFr Bld: 8.5 % — ABNORMAL HIGH (ref 4.6–6.5)

## 2023-03-31 LAB — VITAMIN B12: Vitamin B-12: 995 pg/mL — ABNORMAL HIGH (ref 211–911)

## 2023-03-31 LAB — PSA: PSA: 0.7 ng/mL (ref 0.10–4.00)

## 2023-03-31 MED ORDER — METFORMIN HCL ER 500 MG PO TB24: ORAL_TABLET | ORAL | 3 refills | Status: DC

## 2023-03-31 MED ORDER — EMPAGLIFLOZIN 25 MG PO TABS: 25.0000 mg | ORAL_TABLET | Freq: Every day | ORAL | 3 refills | Status: DC

## 2023-03-31 MED ORDER — METFORMIN HCL ER 500 MG PO TB24: 2000.0000 mg | ORAL_TABLET | Freq: Every day | ORAL | 3 refills | Status: DC

## 2023-03-31 NOTE — Patient Instructions (Signed)

## 2023-03-31 NOTE — Progress Notes (Signed)
Patient ID: Brendan Washington, male   DOB: [birth year], 63 y.o.   MRN: [id number]         Chief Complaint:: wellness exam and dm, htn, hld, low vit d       HPI:  Brendan Washington is a 63 y.o. male here for wellness exam; next cologuard due after July 2025, . Declines pneumovax and shingrix,, o/w up to dete                         Also no longer able to take ozempic due to ileus, sugars became over 200, and went back to 3 metformin per day.  Pt denies chest pain, increased sob or doe, wheezing, orthopnea, PND, increased LE swelling, palpitations, dizziness or syncope.   Pt denies polydipsia, polyuria, or new focal neuro s/s.    Pt denies fever, wt loss, night sweats, loss of appetite, or other constitutional symptoms     Wt Readings from Last 3 Encounters:  03/31/23 247 lb (112 kg)  12/29/22 238 lb (108 kg)  08/26/22 245 lb (111.1 kg)   BP Readings from Last 3 Encounters:  03/31/23 130/78  12/29/22 134/72  08/26/22 120/72   Immunization History  Administered Date(s) Administered   Influenza,inj,Quad PF,6+ Mos 12/25/2017   Health Maintenance Due  Topic Date Due   Pneumococcal Vaccine 49-25 Years old (1 of 2 - PCV) Never done   Zoster Vaccines- Shingrix (1 of 2) Never done   Fecal DNA (Cologuard)  10/03/2018      Past Medical History:  Diagnosis Date   Hypercholesteremia    Hyperglycemia 07/31/2015   Hyperlipidemia 07/31/2015   Past Surgical History:  Procedure Laterality Date   BILATERAL CARPAL TUNNEL RELEASE     CARPAL TUNNEL WITH CUBITAL TUNNEL      reports that he quit smoking about 38 years ago. His smoking use included cigarettes. He has never used smokeless tobacco. He reports current alcohol use. He reports that he does not use drugs. family history includes Healthy in his brother, daughter, and son; Heart disease in his father; Kidney disease in his father; Parkinson's disease in his mother; Stroke in his father. Allergies  Allergen Reactions   Tetanus Toxoid      REACTION: arm swelling   Ozempic (0.25 Or 0.5 Mg-Dose) [Semaglutide(0.25 Or 0.5mg -Dos)] Other (See Comments)    ileus   Penicillins    Current Outpatient Medications on File Prior to Visit  Medication Sig Dispense Refill   Ascorbic Acid (VITAMIN C) 1000 MG tablet Take 1,000 mg by mouth daily.     aspirin EC 81 MG tablet Take 1 tablet (81 mg total) by mouth daily. 90 tablet 11   B Complex-C (SUPER B COMPLEX PO) Take 1 tablet by mouth daily.     Chromium 1000 MCG TABS Take 1 tablet by mouth daily.     CINNAMON PO Take 1,000 mg by mouth daily.     clotrimazole-betamethasone (LOTRISONE) cream APPLY  CREAM TOPICALLY TO AFFECTED AREA ONCE DAILY 30 g 0   ELDERBERRY PO Take 1 tablet by mouth daily.     glucose blood (COOL BLOOD GLUCOSE TEST STRIPS) test strip Use as instructed to check blood sugar daily. DX: E11.9 100 each 12   NON FORMULARY Take 1 tablet by mouth daily. [facility]     pyridoxine (B-6) 100 MG tablet Take 100 mg by mouth daily.     rosuvastatin (CRESTOR) 20 MG tablet Take 1 tablet by  mouth once daily 90 tablet 3   TURMERIC CURCUMIN PO Take 1,500 mg by mouth daily.     vitamin B-12 (CYANOCOBALAMIN) 500 MCG tablet Take 500 mcg by mouth daily.     No current facility-administered medications on file prior to visit.        ROS:  All others reviewed and negative.  Objective        PE:  BP 130/78 (BP Location: Left Arm, Patient Position: Sitting, Cuff Size: Normal)   Pulse 68   Temp 98 F (36.7 C) (Oral)   Ht 5\' 10"  (1.778 m)   Wt 247 lb (112 kg)   SpO2 98%   BMI 35.44 kg/m                 Constitutional: Pt appears in NAD               HENT: Head: NCAT.                Right Ear: External ear normal.                 Left Ear: External ear normal.                Eyes: . Pupils are equal, round, and reactive to light. Conjunctivae and EOM are normal               Nose: without d/c or deformity               Neck: Neck supple. Gross normal ROM                Cardiovascular: Normal rate and regular rhythm.                 Pulmonary/Chest: Effort normal and breath sounds without rales or wheezing.                Abd:  Soft, NT, ND, + BS, no organomegaly               Neurological: Pt is alert. At baseline orientation, motor grossly intact               Skin: Skin is warm. No rashes, no other new lesions, LE edema - none               Psychiatric: Pt behavior is normal without agitation   Micro: none  Cardiac tracings I have personally interpreted today:  none  Pertinent Radiological findings (summarize): none   Lab Results  Component Value Date   WBC 6.1 03/31/2023   HGB 15.6 03/31/2023   HCT 46.0 03/31/2023   PLT 217.0 03/31/2023   GLUCOSE 188 (H) 03/31/2023   CHOL 94 03/31/2023   TRIG 101.0 03/31/2023   HDL 36.80 (L) 03/31/2023   LDLDIRECT 46.0 07/31/2020   LDLCALC 37 03/31/2023   ALT 41 03/31/2023   AST 27 03/31/2023   NA 137 03/31/2023   K 4.4 03/31/2023   CL 103 03/31/2023   CREATININE 1.07 03/31/2023   BUN 13 03/31/2023   CO2 27 03/31/2023   TSH 1.12 03/31/2023   PSA 0.70 03/31/2023   HGBA1C 8.5 (H) 03/31/2023   MICROALBUR 0.9 03/31/2023   Assessment/Plan:  Brendan Washington is a 63 y.o. White or Caucasian [1] male with  has a past medical history of Hypercholesteremia, Hyperglycemia (07/31/2015), and Hyperlipidemia (07/31/2015).  Encounter for well adult exam with abnormal findings Age and sex appropriate education and counseling updated with regular  exercise and diet Referrals for preventative services - none needed Immunizations addressed - declines shingrix and pneumovax Smoking counseling  - none needed Evidence for depression or other mood disorder - none significant Most recent labs reviewed. I have personally reviewed and have noted: 1) the patient's medical and social history 2) The patient's current medications and supplements 3) The patient's height, weight, and BMI have been recorded in the  chart   Diabetes [facility]) Lab Results  Component Value Date   HGBA1C 8.5 (H) 03/31/2023   uncontrolled, pt to increase the metformin ER 500 mg - 4 every day, also to start jardiance 25 mg if affordable  Essential hypertension BP Readings from Last 3 Encounters:  03/31/23 130/78  12/29/22 134/72  08/26/22 120/72   Stable, pt to continue medical treatment  - diet, wt control   Hyperlipidemia Lab Results  Component Value Date   LDLCALC 37 03/31/2023   Stable, pt to continue current statin crestor 20 qd   Vitamin D deficiency Last vitamin D Lab Results  Component Value Date   VD25OH 38.04 03/31/2023   Low, to start oral replacement  Followup: Return in about 6 months (around 09/28/2023).  [name], MD 04/04/2023 1:22 PM [facility] - [facility]

## 2023-04-04 ENCOUNTER — Encounter: Payer: Self-pay | Admitting: Internal Medicine

## 2023-04-04 NOTE — Assessment & Plan Note (Signed)
BP Readings from Last 3 Encounters:  03/31/23 130/78  12/29/22 134/72  08/26/22 120/72   Stable, pt to continue medical treatment  - diet, wt control

## 2023-04-04 NOTE — Assessment & Plan Note (Signed)
Age and sex appropriate education and counseling updated with regular exercise and diet Referrals for preventative services - none needed Immunizations addressed - declines shingrix and pneumovax Smoking counseling  - none needed Evidence for depression or other mood disorder - none significant Most recent labs reviewed. I have personally reviewed and have noted: 1) the patient's medical and social history 2) The patient's current medications and supplements 3) The patient's height, weight, and BMI have been recorded in the chart

## 2023-04-04 NOTE — Assessment & Plan Note (Signed)
Last vitamin D Lab Results  Component Value Date   VD25OH 38.04 03/31/2023   Low, to start oral replacement

## 2023-04-04 NOTE — Assessment & Plan Note (Addendum)
Lab Results  Component Value Date   HGBA1C 8.5 (H) 03/31/2023   uncontrolled, pt to increase the metformin ER 500 mg - 4 every day, also to start jardiance 25 mg if affordable

## 2023-04-04 NOTE — Assessment & Plan Note (Signed)
Lab Results  Component Value Date   LDLCALC 37 03/31/2023   Stable, pt to continue current statin crestor 20 qd

## 2023-04-07 DIAGNOSIS — S92492A Other fracture of left great toe, initial encounter for closed fracture: Secondary | ICD-10-CM | POA: Diagnosis not present

## 2023-04-08 DIAGNOSIS — S9782XA Crushing injury of left foot, initial encounter: Secondary | ICD-10-CM | POA: Diagnosis not present

## 2023-04-08 DIAGNOSIS — M7702 Medial epicondylitis, left elbow: Secondary | ICD-10-CM | POA: Diagnosis not present

## 2023-04-22 DIAGNOSIS — M7702 Medial epicondylitis, left elbow: Secondary | ICD-10-CM | POA: Diagnosis not present

## 2023-04-22 DIAGNOSIS — S9782XA Crushing injury of left foot, initial encounter: Secondary | ICD-10-CM | POA: Diagnosis not present

## 2023-04-22 DIAGNOSIS — M1811 Unilateral primary osteoarthritis of first carpometacarpal joint, right hand: Secondary | ICD-10-CM | POA: Diagnosis not present

## 2023-04-22 DIAGNOSIS — S92425D Nondisplaced fracture of distal phalanx of left great toe, subsequent encounter for fracture with routine healing: Secondary | ICD-10-CM | POA: Diagnosis not present

## 2023-08-10 DIAGNOSIS — G4733 Obstructive sleep apnea (adult) (pediatric): Secondary | ICD-10-CM | POA: Diagnosis not present

## 2023-09-09 DIAGNOSIS — G4733 Obstructive sleep apnea (adult) (pediatric): Secondary | ICD-10-CM | POA: Diagnosis not present

## 2023-10-02 DIAGNOSIS — R1031 Right lower quadrant pain: Secondary | ICD-10-CM | POA: Diagnosis not present

## 2023-10-02 DIAGNOSIS — N200 Calculus of kidney: Secondary | ICD-10-CM | POA: Diagnosis not present

## 2023-10-02 DIAGNOSIS — N401 Enlarged prostate with lower urinary tract symptoms: Secondary | ICD-10-CM | POA: Diagnosis not present

## 2023-10-10 DIAGNOSIS — G4733 Obstructive sleep apnea (adult) (pediatric): Secondary | ICD-10-CM | POA: Diagnosis not present

## 2023-10-13 ENCOUNTER — Other Ambulatory Visit: Payer: Self-pay | Admitting: Internal Medicine

## 2023-10-29 ENCOUNTER — Ambulatory Visit: Admitting: Internal Medicine

## 2023-10-29 ENCOUNTER — Encounter: Payer: Self-pay | Admitting: Internal Medicine

## 2023-10-29 ENCOUNTER — Ambulatory Visit: Payer: Self-pay | Admitting: Internal Medicine

## 2023-10-29 VITALS — BP 134/76 | HR 77 | Temp 98.0°F | Ht 70.0 in | Wt 243.0 lb

## 2023-10-29 DIAGNOSIS — E782 Mixed hyperlipidemia: Secondary | ICD-10-CM

## 2023-10-29 DIAGNOSIS — E559 Vitamin D deficiency, unspecified: Secondary | ICD-10-CM | POA: Diagnosis not present

## 2023-10-29 DIAGNOSIS — I10 Essential (primary) hypertension: Secondary | ICD-10-CM

## 2023-10-29 DIAGNOSIS — G4733 Obstructive sleep apnea (adult) (pediatric): Secondary | ICD-10-CM | POA: Diagnosis not present

## 2023-10-29 DIAGNOSIS — E1165 Type 2 diabetes mellitus with hyperglycemia: Secondary | ICD-10-CM

## 2023-10-29 LAB — CBC WITH DIFFERENTIAL/PLATELET
Basophils Absolute: 0 K/uL (ref 0.0–0.1)
Basophils Relative: 0.5 % (ref 0.0–3.0)
Eosinophils Absolute: 0.2 K/uL (ref 0.0–0.7)
Eosinophils Relative: 2.7 % (ref 0.0–5.0)
HCT: 48 % (ref 39.0–52.0)
Hemoglobin: 16.1 g/dL (ref 13.0–17.0)
Lymphocytes Relative: 31.4 % (ref 12.0–46.0)
Lymphs Abs: 2.2 K/uL (ref 0.7–4.0)
MCHC: 33.6 g/dL (ref 30.0–36.0)
MCV: 93.9 fl (ref 78.0–100.0)
Monocytes Absolute: 0.6 K/uL (ref 0.1–1.0)
Monocytes Relative: 9 % (ref 3.0–12.0)
Neutro Abs: 3.9 K/uL (ref 1.4–7.7)
Neutrophils Relative %: 56.4 % (ref 43.0–77.0)
Platelets: 211 K/uL (ref 150.0–400.0)
RBC: 5.11 Mil/uL (ref 4.22–5.81)
RDW: 13.1 % (ref 11.5–15.5)
WBC: 7 K/uL (ref 4.0–10.5)

## 2023-10-29 LAB — HEPATIC FUNCTION PANEL
ALT: 30 U/L (ref 0–53)
AST: 21 U/L (ref 0–37)
Albumin: 4.5 g/dL (ref 3.5–5.2)
Alkaline Phosphatase: 57 U/L (ref 39–117)
Bilirubin, Direct: 0.2 mg/dL (ref 0.0–0.3)
Total Bilirubin: 0.7 mg/dL (ref 0.2–1.2)
Total Protein: 7.6 g/dL (ref 6.0–8.3)

## 2023-10-29 LAB — BASIC METABOLIC PANEL WITH GFR
BUN: 14 mg/dL (ref 6–23)
CO2: 26 meq/L (ref 19–32)
Calcium: 9.1 mg/dL (ref 8.4–10.5)
Chloride: 101 meq/L (ref 96–112)
Creatinine, Ser: 1.09 mg/dL (ref 0.40–1.50)
GFR: 73.32 mL/min (ref >60.00)
Glucose, Bld: 136 mg/dL — ABNORMAL HIGH (ref 70–99)
Potassium: 4.2 meq/L (ref 3.5–5.1)
Sodium: 138 meq/L (ref 135–145)

## 2023-10-29 LAB — LIPID PANEL
Cholesterol: 96 mg/dL (ref 0–200)
HDL: 42 mg/dL (ref >39.00)
LDL Cholesterol: 32 mg/dL (ref 0–99)
NonHDL: 53.62
Total CHOL/HDL Ratio: 2
Triglycerides: 107 mg/dL (ref 0.0–149.0)
VLDL: 21.4 mg/dL (ref 0.0–40.0)

## 2023-10-29 LAB — POCT GLYCOSYLATED HEMOGLOBIN (HGB A1C): Hemoglobin A1C: 7.2 % — AB (ref 4.0–5.6)

## 2023-10-29 LAB — VITAMIN D 25 HYDROXY (VIT D DEFICIENCY, FRACTURES): VITD: 48.03 ng/mL (ref 30.00–100.00)

## 2023-10-29 LAB — TSH: TSH: 0.93 u[IU]/mL (ref 0.35–5.50)

## 2023-10-29 NOTE — Assessment & Plan Note (Signed)
 Pt to continue cpap, unable for GLP1 due risk of recurring ileus

## 2023-10-29 NOTE — Assessment & Plan Note (Signed)
 BP Readings from Last 3 Encounters:  10/29/23 134/76  03/31/23 130/78  12/29/22 134/72   Mild uncontrolled, pt to continue medical treatment  - diet, wt control, declines add medication today

## 2023-10-29 NOTE — Patient Instructions (Signed)
 Your A1c was done today, and you are given the written report  Please continue all other medications as before, including the metformin  at 3 pills per day, and the Jardiance   Please have the pharmacy call with any other refills you may need.  Please continue your efforts at being more active, low cholesterol diet, and weight control.  Please keep your appointments with your specialists as you may have planned  Please go to the LAB at the blood drawing area for the tests to be done  You will be contacted by phone if any changes need to be made immediately.  Otherwise, you will receive a letter about your results with an explanation, but please check with MyChart first.  Please make an Appointment to return in 6 months, or sooner if needed =

## 2023-10-29 NOTE — Assessment & Plan Note (Signed)
 Last vitamin D Lab Results  Component Value Date   VD25OH 38.04 03/31/2023   Low, to start oral replacement

## 2023-10-29 NOTE — Progress Notes (Signed)
 The test results show that your current treatment is OK, as the tests are stable.  Please continue the same plan.  There is no other need for change of treatment or further evaluation based on these results, at this time.  thanks

## 2023-10-29 NOTE — Assessment & Plan Note (Addendum)
 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

## 2023-10-29 NOTE — Assessment & Plan Note (Signed)
 Lab Results  Component Value Date   LDLCALC 37 03/31/2023   Stable, pt to continue current statin crestor 20 qd

## 2023-10-29 NOTE — Progress Notes (Signed)
 Patient ID: Brendan Washington, male   DOB: 03-04-1960, 63 y.o.   MRN: [id number]        Chief Complaint: follow up HTN, HLD and DM, osa, low vit d       HPI:  Brendan Washington is a 63 y.o. 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  This was deferred at a previous visit to DOT due to elevated A1c 8/5.  Pt denies chest pain, increased sob or doe, wheezing, orthopnea, PND, increased LE swelling, palpitations, dizziness or syncope.   Pt denies polydipsia, polyuria, or new focal neuro s/s.    Pt denies fever, wt loss, night sweats, loss of appetite, or other constitutional symptoms         Wt Readings from Last 3 Encounters:  10/29/23 243 lb (110.2 kg)  03/31/23 247 lb (112 kg)  12/29/22 238 lb (108 kg)   BP Readings from Last 3 Encounters:  10/29/23 134/76  03/31/23 130/78  12/29/22 134/72         Past Medical History:  Diagnosis Date   Hypercholesteremia    Hyperglycemia 07/31/2015   Hyperlipidemia 07/31/2015   Past Surgical History:  Procedure Laterality Date   BILATERAL CARPAL TUNNEL RELEASE     CARPAL TUNNEL WITH CUBITAL TUNNEL      reports that he quit smoking about 38 years ago. His smoking use included cigarettes. He has never used smokeless tobacco. He reports current alcohol use. He reports that he does not use drugs. family history includes Healthy in his brother, daughter, and son; Heart disease in his father; Kidney disease in his father; Parkinson's disease in his mother; Stroke in his father. Allergies  Allergen Reactions   Tetanus Toxoid     REACTION: arm swelling   Ozempic  (0.25 Or 0.5 Mg-Dose) [Semaglutide (0.25 Or 0.5mg -Dos)] Other (See Comments)    ileus   Penicillins    Current Outpatient Medications on File Prior to Visit  Medication Sig Dispense Refill   Ascorbic Acid (VITAMIN C) 1000 MG tablet Take 1,000 mg by mouth daily.     aspirin  EC 81 MG tablet Take 1 tablet (81 mg total) by mouth daily. 90  tablet 11   B Complex-C (SUPER B COMPLEX PO) Take 1 tablet by mouth daily.     Chromium 1000 MCG TABS Take 1 tablet by mouth daily.     CINNAMON PO Take 1,000 mg by mouth daily.     clotrimazole -betamethasone  (LOTRISONE ) cream APPLY  CREAM TOPICALLY TO AFFECTED AREA ONCE DAILY 30 g 0   ELDERBERRY PO Take 1 tablet by mouth daily.     empagliflozin  (JARDIANCE ) 25 MG TABS tablet Take 1 tablet (25 mg total) by mouth daily before breakfast. 90 tablet 3   glucose blood (COOL BLOOD GLUCOSE TEST STRIPS) test strip Use as instructed to check blood sugar daily. DX: E11.9 100 each 12   metFORMIN  (GLUCOPHAGE -XR) 500 MG 24 hr tablet Take 4 tablets (2,000 mg total) by mouth daily with breakfast. 360 tablet 3   NON FORMULARY Take 1 tablet by mouth daily. [facility]     pyridoxine (B-6) 100 MG tablet Take 100 mg by mouth daily.     rosuvastatin  (CRESTOR ) 20 MG tablet Take 1 tablet by mouth once daily 90 tablet 3   TURMERIC CURCUMIN PO Take 1,500 mg by mouth daily.     vitamin B-12 (CYANOCOBALAMIN ) 500 MCG tablet Take 500 mcg by mouth daily.  No current facility-administered medications on file prior to visit.        ROS:  All others reviewed and negative.  Objective        PE:  BP 134/76   Pulse 77   Temp 98 F (36.7 C) (Temporal)   Ht 5' 10 (1.778 m)   Wt 243 lb (110.2 kg)   SpO2 94%   BMI 34.87 kg/m                 Constitutional: Pt appears in NAD               HENT: Head: NCAT.                Right Ear: External ear normal.                 Left Ear: External ear normal.                Eyes: . Pupils are equal, round, and reactive to light. Conjunctivae and EOM are normal               Nose: without d/c or deformity               Neck: Neck supple. Gross normal ROM               Cardiovascular: Normal rate and regular rhythm.                 Pulmonary/Chest: Effort normal and breath sounds without rales or wheezing.                Abd:  Soft, NT, ND, + BS, no organomegaly                Neurological: Pt is alert. At baseline orientation, motor grossly intact               Skin: Skin is warm. No rashes, no other new lesions, LE edema - none               Psychiatric: Pt behavior is normal without agitation   Micro: none  Cardiac tracings I have personally interpreted today:  none  Pertinent Radiological findings (summarize): none   Lab Results  Component Value Date   WBC 6.1 03/31/2023   HGB 15.6 03/31/2023   HCT 46.0 03/31/2023   PLT 217.0 03/31/2023   GLUCOSE 188 (H) 03/31/2023   CHOL 94 03/31/2023   TRIG 101.0 03/31/2023   HDL 36.80 (L) 03/31/2023   LDLDIRECT 46.0 07/31/2020   LDLCALC 37 03/31/2023   ALT 41 03/31/2023   AST 27 03/31/2023   NA 137 03/31/2023   K 4.4 03/31/2023   CL 103 03/31/2023   CREATININE 1.07 03/31/2023   BUN 13 03/31/2023   CO2 27 03/31/2023   TSH 1.12 03/31/2023   PSA 0.70 03/31/2023   HGBA1C 7.2 (A) 10/29/2023   Assessment/Plan:  Brendan Washington is a 63 y.o. White or Caucasian [1] male with  has a past medical history of Hypercholesteremia, Hyperglycemia (07/31/2015), and Hyperlipidemia (07/31/2015).  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, pt to continue current medical treatment jardiance  25 every day, metformin  ER 500 ng - 4 every day - declines  other change today  Essential hypertension BP Readings from Last 3 Encounters:  10/29/23 134/76  03/31/23 130/78  12/29/22 134/72   Mild uncontrolled, pt to continue medical treatment  - diet, wt control, declines add medication today   Hyperlipidemia Lab Results  Component Value Date   LDLCALC 37 03/31/2023   Stable, pt to continue current statin crestor  20 qd   Vitamin D  deficiency Last vitamin D  Lab Results  Component Value  Date   VD25OH 38.04 03/31/2023   Low, to start oral replacement  Followup: Return in about 6 months (around 04/30/2024).  Lynwood Rush, MD 10/29/2023 11:53 AM [facility] - [facility]

## 2023-11-30 NOTE — Telephone Encounter (Signed)
 This is pt contact for C-pap compliance.

## 2024-02-12 IMAGING — CT CT CARDIAC CORONARY ARTERY CALCIUM SCORE
3 series · 14 of 20 positions shown, 16 images · non-contrast
Comparison: None Available.
COMPARISON: None Available.

Addendum:
EXAM:
OVER-READ INTERPRETATION  CT CHEST

The following report is a limited chest CT over-read performed by
08/14/2021. The coronary calcium score interpretation by the
cardiologist is attached.
CLINICAL DATA: Cardiovascular Disease Risk stratification
Coronary Calcium Score
TECHNIQUE: A gated, non-contrast computed tomography scan of the heart was
performed using 3mm slice thickness. Axial images were analyzed on a
dedicated workstation. Calcium scoring of the coronary arteries was
performed using the Agatston method.

[Series 2: cascseq 2.0 sa36 70% (id) · axial · 0.46mm/px · z∈[-261,-171]mm · 4 of 76 slices shown]
[im 16/76  vessel]
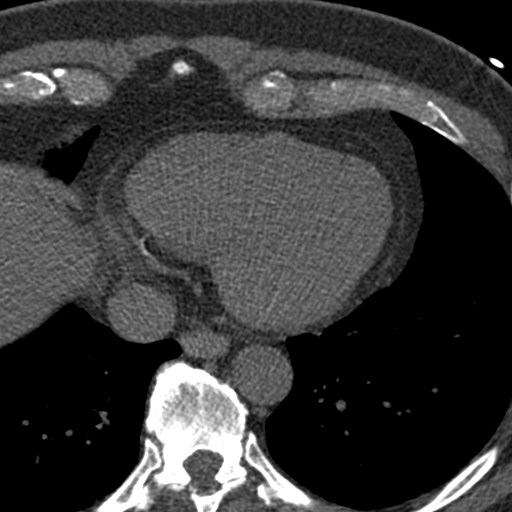
[im 31/76  vessel]
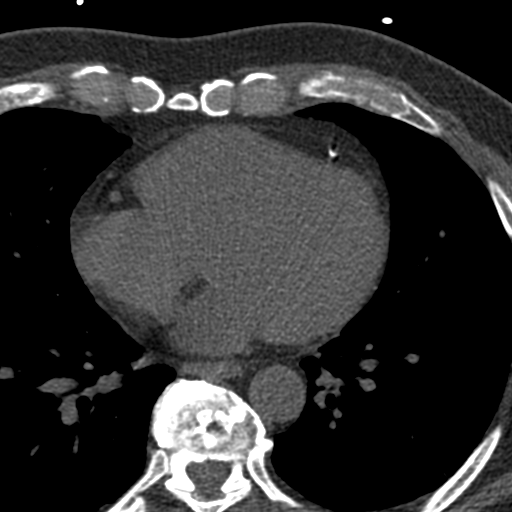
[im 46/76  vessel]
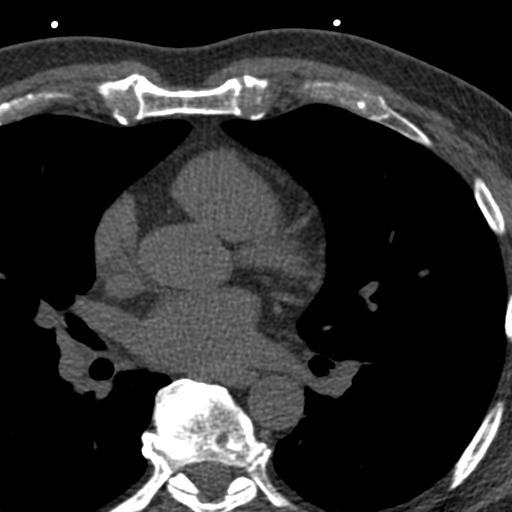
[im 61/76  vessel]
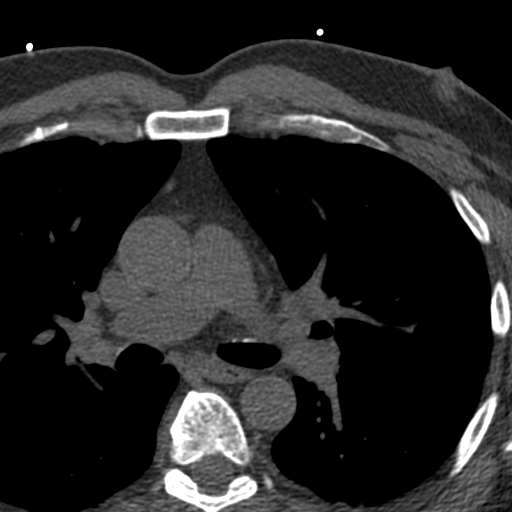

[Series 3: cascseq 2.0 bf37 st · axial · 0.76mm/px · z∈[-267,-167]mm · 5 of 76 slices shown, 7 images]
[im 13/76  vessel]
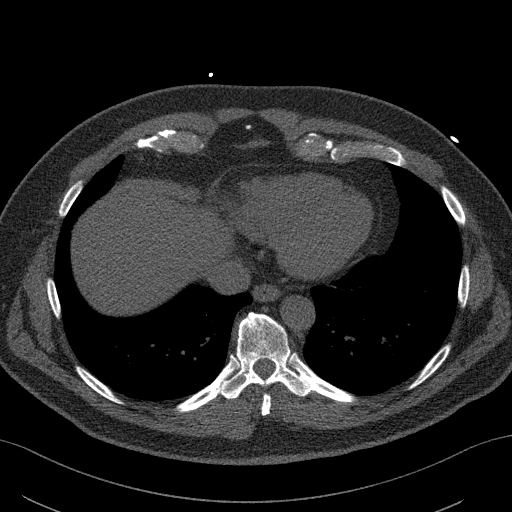
[im 13/76  lung]
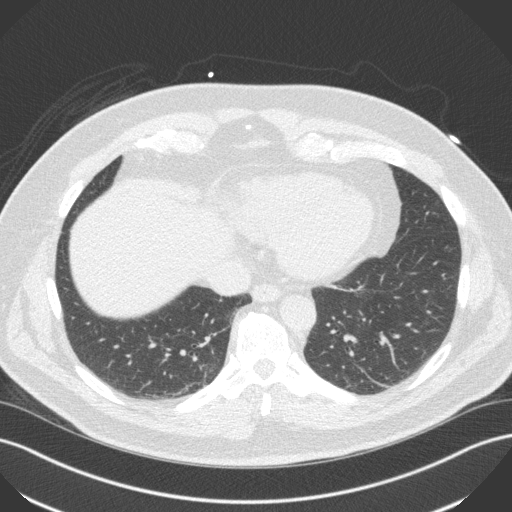
[im 26/76  vessel]
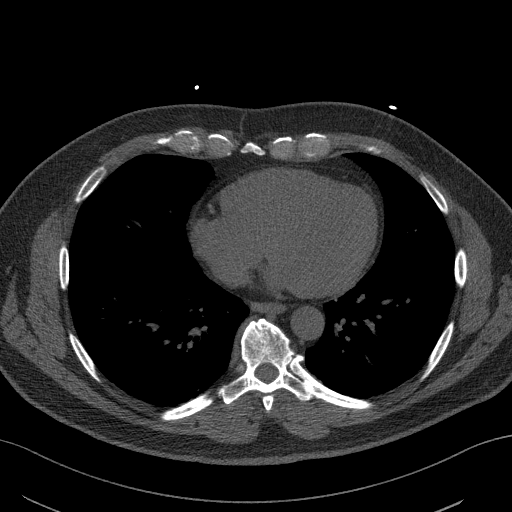
[im 38/76  vessel]
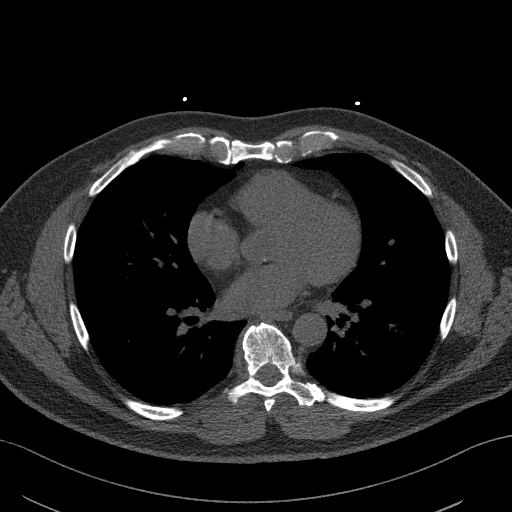
[im 51/76  vessel]
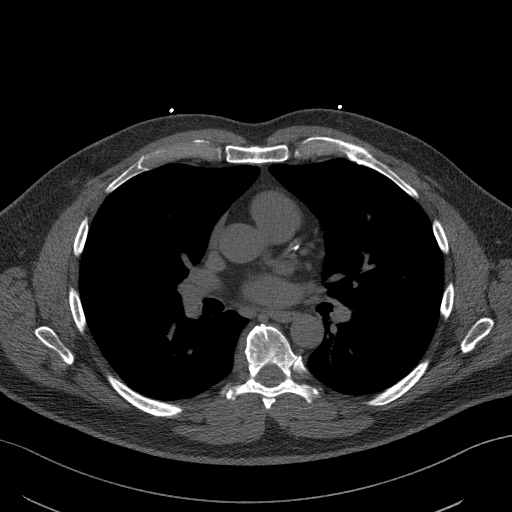
[im 63/76  vessel]
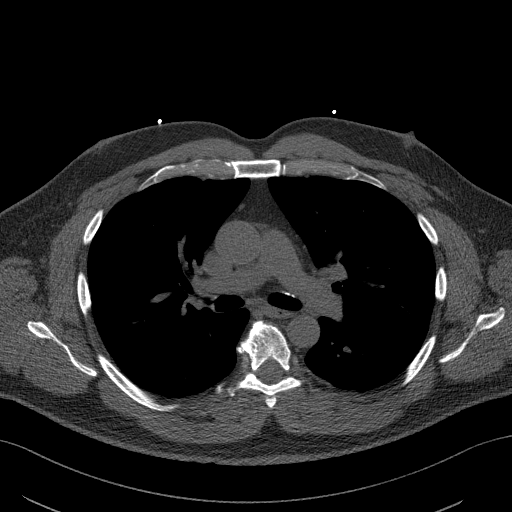
[im 63/76  lung]
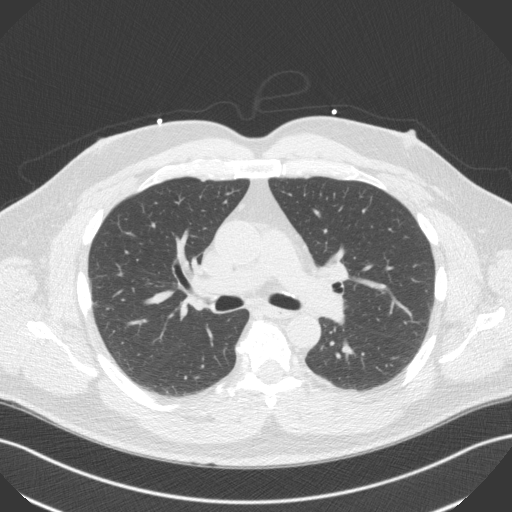

[Series 4: cascseq 2.0 br59 lung · axial · 0.76mm/px · z∈[-267,-167]mm · 5 of 76 slices shown]
[im 13/76  lung]
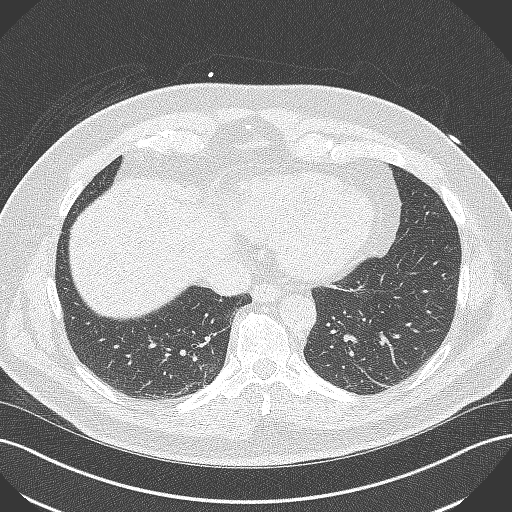
[im 26/76  lung]
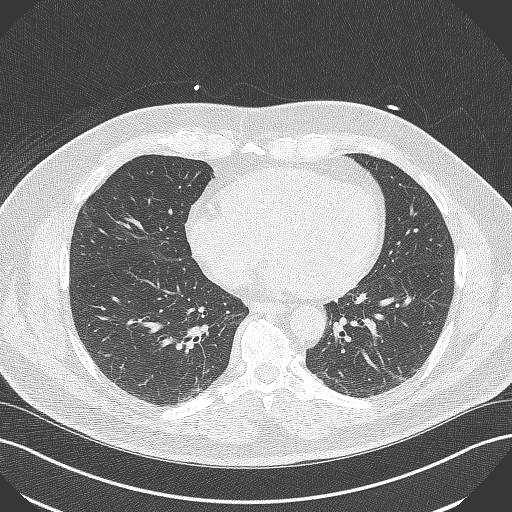
[im 38/76  lung]
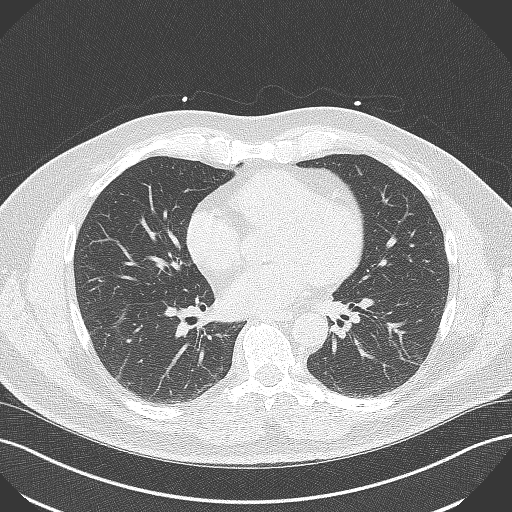
[im 51/76  lung]
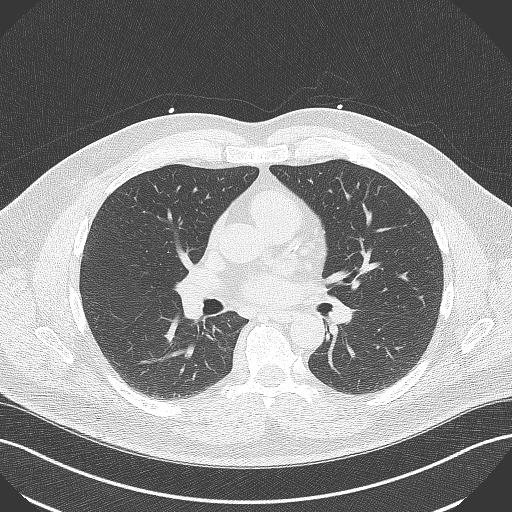
[im 63/76  lung]
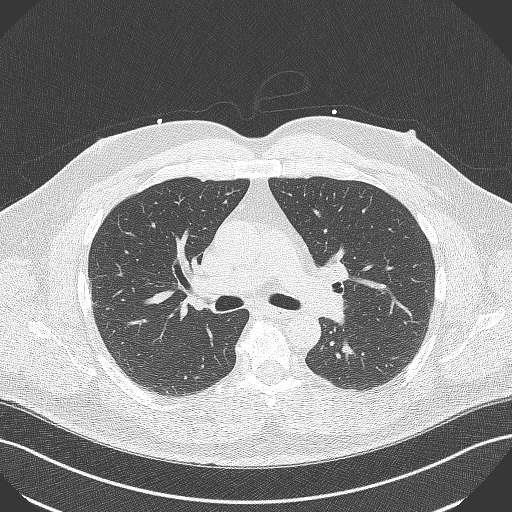

[14 of 20 positions shown; findings below may reference images not displayed]

FINDINGS: Scattered areas of mild linear scarring are noted in the visualize
lungs. 3 mm right middle lobe pulmonary nodule (axial image 50 of
series 4). Within the visualized portions of the thorax there are no
other larger more suspicious appearing pulmonary nodules or masses,
there is no acute consolidative airspace disease, no pleural
effusions, no pneumothorax and no lymphadenopathy. Visualized
portions of the upper abdomen are unremarkable. There are no
aggressive appearing lytic or blastic lesions noted in the
visualized portions of the skeleton.
IMPRESSION: 1. 3 mm right middle lobe pulmonary nodule, nonspecific, but
statistically likely benign. No follow-up needed if patient is
low-risk.This recommendation follows the consensus statement:
Guidelines for Management of Incidental Pulmonary Nodules Detected
FINDINGS: Coronary arteries: Normal origins.

Coronary Calcium Score:

Left main: 0

Left anterior descending artery: 169

Left circumflex artery: 0

Right coronary artery: 49

Total: 218

Percentile: 82

Pericardium: Normal.

Aorta: Normal caliber of ascending aorta. Aortic atherosclerosis
noted.

Non-cardiac: See separate report from [REDACTED].
IMPRESSION: Coronary calcium score of 218. This was 82nd percentile for age-,
race-, and sex-matched controls. Aortic atherosclerosis.



If CAC=0, it is reasonable to withhold statin therapy and reassess
in 5 to 10 years, as long as higher risk conditions are absent
(diabetes mellitus, family history of premature CHD in first degree
relatives (males <55 years; females <65 years), cigarette smoking,
or LDL >=190 mg/dL).

If CAC is 1 to 99, it is reasonable to initiate statin therapy for
patients >=55 years of age.

If CAC is >=100 or >=75th percentile, it is reasonable to initiate
statin therapy at any age.

Cardiology referral should be considered for patients with CAC
scores >=400 or >=75th percentile.

*4776 AHA/ACC/AACVPR/AAPA/ABC/TIGOR/SAPAH/TOT ZNAYET/Coleccionista/BLONDINACKA/PORCHE/ALTHAF
Guideline on the Management of Blood Cholesterol: A Report of the
American College of Cardiology/American Heart Association Task Force
on Clinical Practice Guidelines. J Am Coll Cardiol.
3928;73(24):0350-0081.

*** End of Addendum ***
EXAM:
OVER-READ INTERPRETATION  CT CHEST

The following report is a limited chest CT over-read performed by
08/14/2021. The coronary calcium score interpretation by the
cardiologist is attached.
FINDINGS: Scattered areas of mild linear scarring are noted in the visualize
lungs. 3 mm right middle lobe pulmonary nodule (axial image 50 of
series 4). Within the visualized portions of the thorax there are no
other larger more suspicious appearing pulmonary nodules or masses,
there is no acute consolidative airspace disease, no pleural
effusions, no pneumothorax and no lymphadenopathy. Visualized
portions of the upper abdomen are unremarkable. There are no
aggressive appearing lytic or blastic lesions noted in the
visualized portions of the skeleton.
IMPRESSION: 1. 3 mm right middle lobe pulmonary nodule, nonspecific, but
statistically likely benign. No follow-up needed if patient is
low-risk.This recommendation follows the consensus statement:
Guidelines for Management of Incidental Pulmonary Nodules Detected

## 2024-03-23 ENCOUNTER — Other Ambulatory Visit: Payer: Self-pay | Admitting: Internal Medicine

## 2024-03-23 ENCOUNTER — Other Ambulatory Visit: Payer: Self-pay

## 2024-05-02 ENCOUNTER — Ambulatory Visit: Admitting: Internal Medicine
